# Patient Record
Sex: Female | Born: 1975 | Hispanic: Yes | Marital: Single | Smoking: Never smoker
Health system: Southern US, Community
[De-identification: ages and names within clinical notes are randomized; demographics above are authoritative.]

## PROBLEM LIST (undated history)

## (undated) DIAGNOSIS — F319 Bipolar disorder, unspecified: Secondary | ICD-10-CM

## (undated) DIAGNOSIS — Z5189 Encounter for other specified aftercare: Secondary | ICD-10-CM

## (undated) DIAGNOSIS — D649 Anemia, unspecified: Secondary | ICD-10-CM

## (undated) DIAGNOSIS — K219 Gastro-esophageal reflux disease without esophagitis: Secondary | ICD-10-CM

## (undated) DIAGNOSIS — F32A Depression, unspecified: Secondary | ICD-10-CM

## (undated) DIAGNOSIS — F419 Anxiety disorder, unspecified: Secondary | ICD-10-CM

## (undated) HISTORY — DX: Encounter for other specified aftercare: Z51.89

## (undated) HISTORY — PX: TUBAL LIGATION: SHX77

## (undated) HISTORY — PX: BREAST SURGERY: SHX581

## (undated) HISTORY — DX: Depression, unspecified: F32.A

---

## 2004-10-30 ENCOUNTER — Inpatient Hospital Stay (HOSPITAL_COMMUNITY): Admission: AD | Admit: 2004-10-30 | Discharge: 2004-10-30 | Payer: Self-pay | Admitting: Obstetrics & Gynecology

## 2004-11-08 ENCOUNTER — Inpatient Hospital Stay (HOSPITAL_COMMUNITY): Admission: AD | Admit: 2004-11-08 | Discharge: 2004-11-08 | Payer: Self-pay | Admitting: Obstetrics & Gynecology

## 2007-06-08 ENCOUNTER — Ambulatory Visit: Payer: Self-pay | Admitting: Obstetrics and Gynecology

## 2007-06-08 ENCOUNTER — Encounter: Payer: Self-pay | Admitting: Obstetrics and Gynecology

## 2009-10-24 HISTORY — PX: BREAST BIOPSY: SHX20

## 2020-05-25 ENCOUNTER — Other Ambulatory Visit: Payer: Self-pay | Admitting: Obstetrics and Gynecology

## 2020-05-25 DIAGNOSIS — Z1231 Encounter for screening mammogram for malignant neoplasm of breast: Secondary | ICD-10-CM

## 2020-06-23 ENCOUNTER — Other Ambulatory Visit: Payer: Self-pay

## 2020-06-23 ENCOUNTER — Ambulatory Visit
Admission: RE | Admit: 2020-06-23 | Discharge: 2020-06-23 | Disposition: A | Discharge status: Home / Self Care | Payer: No Typology Code available for payment source | Source: Ambulatory Visit | Attending: Obstetrics and Gynecology | Admitting: Obstetrics and Gynecology

## 2020-06-23 ENCOUNTER — Ambulatory Visit: Payer: Self-pay | Admitting: *Deleted

## 2020-06-23 VITALS — BP 106/62 | Temp 97.7°F | Wt 137.9 lb

## 2020-06-23 DIAGNOSIS — Z01419 Encounter for gynecological examination (general) (routine) without abnormal findings: Secondary | ICD-10-CM

## 2020-06-23 DIAGNOSIS — N9489 Other specified conditions associated with female genital organs and menstrual cycle: Secondary | ICD-10-CM

## 2020-06-23 DIAGNOSIS — Z1231 Encounter for screening mammogram for malignant neoplasm of breast: Secondary | ICD-10-CM

## 2020-06-23 NOTE — Progress Notes (Signed)
[name] is a 44 y.o. No obstetric history on file. female who presents to [facility] clinic today with complaints of right outer breast pain x 2 years that has been occurring since prior to last mammogram 06/28/2018. Patient stated the pain comes and goes. Patient rated the pain at a 2-3 out of 10.    Pap Smear: Pap smear completed today. Last Pap smear was 06/07/2017 and was normal per patient. Per patient has a history of two abnormal Pap smears. The first abnormal was around 2005 or 2006 that a colposcopy was completed for follow-up. 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 Patient stated she has had at least three normal Pap smears since last colposcopy. Last Pap smear result is not available in Epic.   Physical exam: Breasts Breasts symmetrical. No skin abnormalities left breast. Observed a scar on the right outer breast that per patient had a lumpectomy completed in 2011 due to recurrent mastitis. No nipple retraction bilateral breasts. No nipple discharge bilateral breasts. No lymphadenopathy. No lumps palpated bilateral breasts. Complaints of right outer breast tenderness on exam.       Pelvic/Bimanual Ext Genitalia No lesions, no swelling and no discharge observed on external genitalia.        Vagina Vagina pink and normal texture. No lesions or discharge observed in vagina.        Cervix Cervix is present. Cervix pink and of normal texture. No discharge observed.    Uterus Uterus is present and palpable. Uterus in normal position and normal size.        Adnexae Bilateral ovaries present and palpable. Complaints of right ovary tenderness on exam. Sent referral to the [facility] for [place] for follow-up.   Rectovaginal No rectal exam completed today since patient had no rectal complaints. No skin abnormalities observed on exam.     Smoking History: Patient has never smoked.   Patient  Navigation: Patient education provided. Access to services provided for patient through B[facility]CP program.    Breast and Cervical Cancer Risk Assessment: Patient does not have family history of breast cancer, known genetic mutations, or radiation treatment to the chest before age 25. Per patient has history of cervical dysplasia. Patient has no history of being immunocompromised or DES exposure in-utero.  Risk Assessment    Risk Scores      06/23/2020   Last edited by: Demetrius Revel, LPN   5-year risk: 0.6 %   Lifetime risk: 6.6 %          A: B[facility]CP exam with pap smear Complaint of right outer breast pain x 2 years. Previous diagnostic mammogram 06/28/2018 was negative.  P: Referred patient to the [facility] for a screening mammogram on the mobile unit. Appointment scheduled Tuesday, June 23, 2020 at 1500.  Loletta Parish, RN 06/23/2020 2:57 PM

## 2020-06-23 NOTE — Patient Instructions (Signed)
Explained breast self awareness with [name]. Pap smear completed today. Let patient know [facility]CP will cover Pap smears and HPV typing every 5 years unless has a history of abnormal Pap smears. Referred patient to the [facility] for a screening mammogram on the mobile unit. Appointment scheduled Tuesday, June 23, 2020 at 1500. Patient escorted to mobile unit for mammogram following B[facility]CP appointment. Let patient know will follow up with her within the next couple weeks with results of Pap smear by letter or phone. Informed patient that the [facility] will follow-up with her within the next couple of weeks with results of mammogram by letter or phone. Told patient that the [facility] [place] will call you with follow-up appointment for the AUB and right ovary pain. Patient informed that [facility] will not cover follow-up appointment at the [facility] for [place] and right ovary pain. Patient given the [place] application. [name] verbalized understanding.  Wilbon Obenchain, Arvil Chaco, RN 2:58 PM

## 2020-06-26 LAB — CYTOLOGY - PAP
Comment: NEGATIVE
Diagnosis: UNDETERMINED — AB
High risk HPV: NEGATIVE

## 2020-07-01 ENCOUNTER — Telehealth: Payer: Self-pay

## 2020-07-01 NOTE — Telephone Encounter (Signed)
(  06/30/2020) [place], [place], Patient informed pap results: ASC-US, HPV-negative. Discussed ASC-US with patient, states all questions were answered, verbalized understanding.

## 2020-08-18 ENCOUNTER — Encounter: Payer: Self-pay | Admitting: Family Medicine

## 2020-08-18 ENCOUNTER — Other Ambulatory Visit: Payer: Self-pay

## 2020-08-18 ENCOUNTER — Ambulatory Visit (INDEPENDENT_AMBULATORY_CARE_PROVIDER_SITE_OTHER): Payer: No Typology Code available for payment source | Admitting: Family Medicine

## 2020-08-18 VITALS — BP 126/88 | HR 79 | Wt 143.2 lb

## 2020-08-18 DIAGNOSIS — N939 Abnormal uterine and vaginal bleeding, unspecified: Secondary | ICD-10-CM

## 2020-08-18 NOTE — Progress Notes (Signed)
   Subjective:    Patient ID: Samantha Day, female    DOB: [birth year], 44 y.o.   MRN: [id number]  HPI This is a 44 yo female with a history of AUB. She has always had heavy bleeding with regular cycle. 7-8 days of bleeding, changing saturated pads 5-6 times during the day, bleeds through pads at night. Has tried depo, COCs, patch in the past. Had side effects with these - increased anxiety, headaches, weight gain.   She was admitted recently for anemia and needed blood transfusion. Still feel weak and tired during periods.  I have reviewed the patients past medical, family, and social history.  I have reviewed the patient's medication list and allergies.   Review of Systems     Objective:   Physical Exam Vitals reviewed. Exam conducted with a chaperone present.  Constitutional:      Appearance: Normal appearance.  Cardiovascular:     Rate and Rhythm: Normal rate and regular rhythm.  Abdominal:     General: There is no distension.     Palpations: There is no mass.     Tenderness: There is no abdominal tenderness. There is no guarding or rebound.     Hernia: No hernia is present. There is no hernia in the left inguinal area or right inguinal area.  Genitourinary:    General: Normal vulva.     Exam position: Supine.     Labia:        Right: No rash, tenderness or lesion.        Left: No rash, tenderness or lesion.      Vagina: No signs of injury and foreign body. No vaginal discharge, erythema, tenderness or bleeding.     Cervix: No cervical motion tenderness, discharge, friability, lesion, erythema or cervical bleeding.     Uterus: Enlarged (14 week uterus size). Not deviated, not fixed and not tender.      Adnexa:        Right: No mass, tenderness or fullness.         Left: No mass, tenderness or fullness.    Lymphadenopathy:     Lower Body: No right inguinal adenopathy. No left inguinal adenopathy.  Skin:    Capillary Refill: Capillary refill takes less than 2 seconds.    Neurological:     General: No focal deficit present.     Mental Status: She is alert.  Psychiatric:        Mood and Affect: Mood normal.        Behavior: Behavior normal.        Thought Content: Thought content normal.        Judgment: Judgment normal.       Assessment & Plan:  1. Abnormal uterine bleeding (AUB) Check labs and [place]. Discussed IUD vs surgical options. Patient would like to think of options. Applying for financial assistance. - CBC - TSH - Follicle stimulating hormone - LH - US PELVIS TRANSVAGINAL NON-OB (TV ONLY); Future

## 2020-08-18 NOTE — Progress Notes (Signed)
U/S scheduled for 08/31/20 @ 1000.  Pt notified.

## 2020-08-19 LAB — CBC
Hematocrit: 36.5 % (ref 34.0–46.6)
Hemoglobin: 11.4 g/dL (ref 11.1–15.9)
MCH: 26.5 pg — ABNORMAL LOW (ref 26.6–33.0)
MCHC: 31.2 g/dL — ABNORMAL LOW (ref 31.5–35.7)
MCV: 85 fL (ref 79–97)
Platelets: 325 10*3/uL (ref 150–450)
RBC: 4.3 x10E6/uL (ref 3.77–5.28)
RDW: 15.8 % — ABNORMAL HIGH (ref 11.7–15.4)
WBC: 6.7 10*3/uL (ref 3.4–10.8)

## 2020-08-19 LAB — FOLLICLE STIMULATING HORMONE: FSH: 12.4 m[IU]/mL

## 2020-08-19 LAB — LUTEINIZING HORMONE: LH: 11.5 m[IU]/mL

## 2020-08-19 LAB — TSH: TSH: 2.2 u[IU]/mL (ref 0.450–4.500)

## 2020-08-24 ENCOUNTER — Telehealth: Payer: Self-pay | Admitting: Lactation Services

## 2020-08-24 NOTE — Telephone Encounter (Signed)
-----   Message from Truett Mainland, DO sent at 08/21/2020  3:26 PM EDT ----- Please let the patient know that her labs were all normal.

## 2020-08-24 NOTE — Telephone Encounter (Signed)
Called to inform patient that her labs were all normal. Patient without questions or concerns.

## 2020-08-31 ENCOUNTER — Other Ambulatory Visit: Payer: Self-pay

## 2020-08-31 ENCOUNTER — Ambulatory Visit
Admission: RE | Admit: 2020-08-31 | Discharge: 2020-08-31 | Disposition: A | Discharge status: Home / Self Care | Payer: No Typology Code available for payment source | Source: Ambulatory Visit | Attending: Family Medicine | Admitting: Family Medicine

## 2020-08-31 DIAGNOSIS — N939 Abnormal uterine and vaginal bleeding, unspecified: Secondary | ICD-10-CM | POA: Insufficient documentation

## 2020-10-07 ENCOUNTER — Encounter: Payer: Self-pay | Admitting: Obstetrics & Gynecology

## 2020-10-07 ENCOUNTER — Ambulatory Visit: Payer: No Typology Code available for payment source | Admitting: Family Medicine

## 2020-10-07 ENCOUNTER — Other Ambulatory Visit: Payer: Self-pay

## 2020-10-07 ENCOUNTER — Ambulatory Visit: Payer: Self-pay | Admitting: Clinical

## 2020-10-07 ENCOUNTER — Ambulatory Visit (INDEPENDENT_AMBULATORY_CARE_PROVIDER_SITE_OTHER): Payer: Self-pay | Admitting: Obstetrics & Gynecology

## 2020-10-07 VITALS — BP 116/83 | HR 70 | Wt 137.4 lb

## 2020-10-07 DIAGNOSIS — D252 Subserosal leiomyoma of uterus: Secondary | ICD-10-CM

## 2020-10-07 DIAGNOSIS — Z1331 Encounter for screening for depression: Secondary | ICD-10-CM

## 2020-10-07 DIAGNOSIS — D251 Intramural leiomyoma of uterus: Secondary | ICD-10-CM

## 2020-10-07 DIAGNOSIS — D250 Submucous leiomyoma of uterus: Secondary | ICD-10-CM

## 2020-10-07 DIAGNOSIS — N920 Excessive and frequent menstruation with regular cycle: Secondary | ICD-10-CM

## 2020-10-07 LAB — POCT PREGNANCY, URINE: Preg Test, Ur: NEGATIVE

## 2020-10-07 MED ORDER — NAPROXEN 500 MG PO TABS
500.0000 mg | ORAL_TABLET | Freq: Two times a day (BID) | ORAL | 2 refills | Status: DC
Start: 2020-10-07 — End: 2021-01-20

## 2020-10-07 NOTE — Progress Notes (Signed)
GYNECOLOGY OFFICE VISIT NOTE  History:   Samantha Day is a 44 y.o. [phone] here today for discussion about management of menorrhagia and fibroids. Was seen by Dr. Nehemiah Settle on 08/18/2020, reports having no success and concerning side effects after trying hormonal management options (Depo Provera, COCs, patch). She denies any current abnormal vaginal discharge, bleeding, pelvic pain or other concerns.    Past Medical History:  Diagnosis Date  . Blood transfusion without reported diagnosis   . Depression     Past Surgical History:  Procedure Laterality Date  . BREAST SURGERY    . TUBAL LIGATION      The following portions of the patient's history were reviewed and updated as appropriate: allergies, current medications, past family history, past medical history, past social history, past surgical history and problem list.   Health Maintenance:  ASCUS pap and negative HRHPV on 06/23/2020.  Normal mammogram on 06/25/2020.   Review of Systems:  Pertinent items noted in HPI and remainder of comprehensive ROS otherwise negative.  Physical Exam:  BP 116/83   Pulse 70   Wt 137 lb 6.4 oz (62.3 kg)   LMP 09/28/2020 (Exact Date)  CONSTITUTIONAL: Well-developed, well-nourished female in no acute distress.  HEENT:  Normocephalic, atraumatic. External right and left ear normal. No scleral icterus.  NECK: Normal range of motion, supple, no masses noted on observation SKIN: No rash noted. Not diaphoretic. No erythema. No pallor. MUSCULOSKELETAL: Normal range of motion. No edema noted. NEUROLOGIC: Alert and oriented to person, place, and time. Normal muscle tone coordination. No cranial nerve deficit noted. PSYCHIATRIC: Normal mood and affect. Normal behavior. Normal judgment and thought content. CARDIOVASCULAR: Normal heart rate noted RESPIRATORY: Effort and breath sounds normal, no problems with respiration noted ABDOMEN: No masses noted. No other overt distention noted.   PELVIC:  Deferred  Labs and Imaging Results for orders placed or performed in visit on 10/07/20 (from the past 168 hour(s))  Pregnancy, urine POC   Collection Time: 10/07/20 10:28 AM  Result Value Ref Range   Preg Test, Ur NEGATIVE NEGATIVE   CBC Latest Ref Rng & Units 08/18/2020  WBC 3.4 - 10.8 x10E3/uL 6.7  Hemoglobin 11.1 - 15.9 g/dL 11.4  Hematocrit 34.0 - 46.6 % 36.5  Platelets 150 - 450 x10E3/uL 325   US PELVIS TRANSVAGINAL NON-OB (TV ONLY)  Result Date: 09/01/2020 CLINICAL DATA:  Abnormal uterine bleeding. Enlarged uterus on pelvic exam. LMP 08/04/2020. EXAM: ULTRASOUND PELVIS TRANSVAGINAL TECHNIQUE: Transvaginal ultrasound examination of the pelvis was performed including evaluation of the uterus, ovaries, adnexal regions, and pelvic cul-de-sac. COMPARISON:  None. FINDINGS: Uterus Measurements: 10.9 x 6.2 x 9.1 cm = volume: 321 mL. Multiple uterine fibroids are seen, which are submucosal, intramural, and subserosal in location. The 3 largest fibroids measure 4.2 cm, 2.2 cm, and 1.4 cm in maximum diameter. Endometrium Thickness: 19 mm.  No focal abnormality visualized. Right ovary Measurements: 4.0 x 2.3 x 2.7 cm = volume: 12.8 mL. Normal appearance/no adnexal mass. Left ovary Measurements: 3.8 x 1.9 x 1.6 cm = volume: 6.2 mL. Normal appearance/no adnexal mass. Other findings:  No abnormal free fluid IMPRESSION: Multiple uterine fibroids, largest measuring 4.2 cm. Endometrial thickness measures 19 mm. If bleeding remains unresponsive to hormonal or medical therapy, focal lesion work-up with sonohysterogram should be considered. Endometrial biopsy should also be considered in pre-menopausal patients at high risk for endometrial carcinoma. (Ref: Radiological Reasoning: Algorithmic Workup of Abnormal Vaginal Bleeding with Endovaginal Sonography and Sonohysterography. AJR 2008; 979:G92-11) Normal appearance  of both ovaries. Electronically Signed   By: Marlaine Hind M.D.   On: 09/01/2020 08:28        Assessment and Plan:      1. Positive depression screening Positive PHQ9 screen on check in, talked to [facility] clinician today. Denies HI/SI. Also had food insecuroty issues, taken to [place]. - Amb ref to [facility]  2. Menorrhagia with regular cycle 3. Intramural, submucous, and subserous leiomyoma of uterus Discussed management options for abnormal uterine bleeding and fibroids including NSAIDs (Naproxen), tranexamic acid (Lysteda), Oriahnn, oral progesterone, Depo Provera, Levonogestrel IUD, endometrial ablation or hysterectomy (TVH given uterine size and three term SVDs) as definitive surgical management.  Discussed risks and benefits of each method.  She wants to avoid hormones given previous side effects.  Patient desires minimally invasive procedure of hysteroscopic myomectomy and endometrial ablation.  Printed patient education handouts were given to the patient to review at home. Advised to research Lysteda for now, but Naproxen prescribed.   Bleeding precautions reviewed.  - naproxen (NAPROSYN) 500 MG tablet; Take 1 tablet (500 mg total) by mouth 2 (two) times daily with a meal. As needed for pain and during periods.   Dispense: 30 tablet; Refill: 2  Procedure: Hysteroscopy, Dilation and Curettage, Myomectomy with Myosure, Hydrothermal Endometrial Ablation  Indications: Menorrhagia, uterine fibroids with possible submucosal component.  The risks of surgery were discussed in detail with the patient including but not limited to: bleeding; infection; injury to uterus or other surrounding organs; formation of intrauterine adhesions; need for additional procedures including laparoscopy or subsequent procedures secondary to abnormal pathology; thromboembolic phenomenon; and other postoperative or anesthesia complications. Postoperative expectations were also discussed in detail. The patient also understands the alternative treatment options which were discussed in full. All  questions were answered.  She was told that she will be contacted by our surgical scheduler regarding the time and date of her surgery; routine preoperative instructions will be given to her by the preoperative nursing team.   She is aware of need for preoperative COVID testing and subsequent quarantine from time of test to time of surgery; she will be given further preoperative instructions at that [place] screening visit.  Printed patient education handouts about the procedure were given to the patient to review at home.   Please refer to After Visit Summary for other counseling recommendations.   Routine preventative health maintenance measures emphasized. Please refer to After Visit Summary for other counseling recommendations.   Return for any gynecologic concerns.    I spent 30 minutes dedicated to the care of this patient including pre-visit review of records, face to face time with the patient discussing her conditions and treatments and post visit ordering of testing.    Verita Schneiders, MD, [place] for [employer], [place]

## 2020-10-07 NOTE — BH Specialist Note (Signed)
Integrated [facility]al [facility] Initial In-Person Visit  MRN: [id number] Name: Samantha Day  Number of [place] Clinician visits:: 1/6 Session Start time: 11:05  Session End time: 11:15 Total time: 10 minutes  Types of Service: Introduction only  Interpretor:No. Interpretor Name and Language: n/a   Warm Hand Off Completed.       Subjective: Samantha Day is a 44 y.o. female accompanied by n/a Patient was referred by Verita Schneiders, MD for positive depression screen. Patient reports the following symptoms/concerns: Pt denies current SI, no plan, no intent; has passive SI at times; is currently seeing therapist every two weeks and her PCP is managing her [place] medication. Pt is taking Zoloft 50mg  (increased to 75mg  makes her "jittery", "can't sleep", so went back down to 50mg , which helps improve her sleep prior to no meds). Pt has had some difficulty with sleep the past few weeks, and increased anxiety.  Duration of problem: Ongoing; Severity of problem: moderately severe  Objective: Mood: Anxious and Affect: Appropriate Risk of harm to self or others: Suicidal ideation No plan to harm self or others Passive SI with none current  Life Context: Family and Social: - School/Work: - Self-Care: - Life Changes: -  Patient and/or Family's Strengths/Protective Factors: Adhering to current treatment   Goals Addressed: Patient will: 1. Reduce symptoms of: anxiety and depression 2. Increase knowledge and/or ability of: stress reduction  3. Demonstrate ability to: Safety  Progress towards Goals: Ongoing   Interventions: Interventions utilized: Supportive Counseling and Link to [employer]  Standardized Assessments completed: GAD-7 and PHQ 9  Patient and/or Family Response: Pt agrees with treatment plan  Patient Centered Plan: Patient is on the following Treatment Plan(s):  N/A  Assessment: Patient currently experiencing Positive depression  screen.   Patient may benefit from supportive counseling today and link to community resource.  Plan: 1. Follow up with behavioral health clinician on : Call Farah Benish at [phone] as needed 2. Behavioral recommendations:  -Continue attending therapy sessions and following therapist treatment plan -Continue seeing PCP for [facility] medication management -Follow Safety Plan with therapist (and may use [facility] walk-in 24/7 for [facility], if SI returns) -Consider using sleep sound app, as discussed (may help with falling asleep) -Take home food from [employer] today 3. Referral(s): Integrated [employer] (In Clinic) and Community Resources:  Food  Caroleen Hamman [place], [place]  Depression screen [facility] 2/9 10/07/2020 08/18/2020  Decreased Interest 1 1  Down, Depressed, Hopeless 1 1  PHQ - 2 Score 2 2  Altered sleeping 1 1  Tired, decreased energy 1 1  Change in appetite 1 1  Feeling bad or failure about yourself  1 2  Trouble concentrating 1 1  Moving slowly or fidgety/restless 1 0  Suicidal thoughts 1 0  PHQ-9 Score 9 8   GAD 7 : Generalized Anxiety Score 10/07/2020 08/18/2020  Nervous, Anxious, on Edge 2 2  Control/stop worrying 2 2  Worry too much - different things 2 2  Trouble relaxing 2 2  Restless 2 1  Easily annoyed or irritable 2 1  Afraid - awful might happen 2 2  Total GAD 7 Score 14 12

## 2020-10-07 NOTE — Patient Instructions (Addendum)
Research [place] for reducing bleeding during menstrual periods  Take Naproxen twice a day during periods, with food  You will be contacted with details about upcoming surgery  Hysteroscopy Hysteroscopy is a procedure that is used to examine the inside of a woman's womb (uterus). This may be done for various reasons, including:  To look for lumps (tumors) and other growths in the uterus.  To evaluate abnormal bleeding, fibroid tumors, polyps, scar tissue (adhesions), or cancer of the uterus.  To determine the cause of an inability to get pregnant (infertility) or repeated losses of pregnancies (miscarriages).  To find a lost IUD (intrauterine device).  To perform a procedure that permanently prevents pregnancy (sterilization). During this procedure, a thin, flexible tube with a small light and camera (hysteroscope) is used to examine the uterus. The camera sends images to a monitor in the room so that your health care provider can view the inside of your uterus. A hysteroscopy should be done right after a menstrual period to make sure that you are not pregnant. Tell a health care provider about:  Any allergies you have.  All medicines you are taking, including vitamins, herbs, eye drops, creams, and over-the-counter medicines.  Any problems you or family members have had with the use of anesthetic medicines.  Any blood disorders you have.  Any surgeries you have had.  Any medical conditions you have.  Whether you are pregnant or may be pregnant. What are the risks? Generally, this is a safe procedure. However, problems may occur, including:  Excessive bleeding.  Infection.  Damage to the uterus or other structures or organs.  Allergic reaction to medicines or fluids that are used in the procedure. What happens before the procedure? Staying hydrated Follow instructions from your health care provider about hydration, which may include:  Up to 2 hours before the procedure  - you may continue to drink clear liquids, such as water, clear fruit juice, black coffee, and plain tea. Eating and drinking restrictions Follow instructions from your health care provider about eating and drinking, which may include:  8 hours before the procedure - stop eating solid foods and drink clear liquids only  2 hours before the procedure - stop drinking clear liquids. General instructions  Ask your health care provider about: ? Changing or stopping your normal medicines. This is important if you take diabetes medicines or blood thinners. ? Taking medicines such as aspirin and ibuprofen. These medicines can thin your blood and cause bleeding. Do not take these medicines for 1 week before your procedure, or as told by your health care provider.  Do not use any products that contain nicotine or tobacco for 2 weeks before the procedure. This includes cigarettes and e-cigarettes. If you need help quitting, ask your health care provider.  Medicine may be placed in your cervix the day before the procedure. This medicine causes the cervix to have a larger opening (dilate). The larger opening makes it easier for the hysteroscope to be inserted into the uterus during the procedure.  Plan to have someone with you for the first 24-48 hours after the procedure, especially if you are given a medicine to make you fall asleep (general anesthetic).  Plan to have someone take you home from the hospital or clinic. What happens during the procedure?  To lower your risk of infection: ? Your health care team will wash or sanitize their hands. ? Your skin will be washed with soap. ? Hair may be removed from the surgical  area.  An IV tube will be inserted into one of your veins.  You may be given one or more of the following: ? A medicine to help you relax (sedative). ? A medicine that numbs the area around the cervix (local anesthetic). ? A medicine to make you fall asleep (general  anesthetic).  A hysteroscope will be inserted through your vagina and into your uterus.  Air or fluid will be used to enlarge your uterus, enabling your health care provider to see your uterus better. The amount of fluid used will be carefully checked throughout the procedure.  In some cases, tissue may be gently scraped from inside the uterus and sent to a lab for testing (biopsy). The procedure may vary among health care providers and hospitals. What happens after the procedure?  Your blood pressure, heart rate, breathing rate, and blood oxygen level will be monitored until the medicines you were given have worn off.  You may have some cramping. You may be given medicines for this.  You may have bleeding, which varies from light spotting to menstrual-like bleeding. This is normal.  If you had a biopsy done, it is your responsibility to get the results of your procedure. Ask your health care provider, or the department performing the procedure, when your results will be ready. Summary  Hysteroscopy is a procedure that is used to examine the inside of a woman's womb (uterus).  After the procedure, you may have bleeding, which varies from light spotting to menstrual-like bleeding. This is normal. You may also have cramping.  Plan to have someone take you home from the hospital or clinic. This information is not intended to replace advice given to you by your health care provider. Make sure you discuss any questions you have with your health care provider. Document Revised: 09/22/2017 Document Reviewed: 11/08/2016 Elsevier Patient Education  2020 [place].    Endometrial Ablation Endometrial ablation is a procedure that destroys the thin inner layer of the lining of the uterus (endometrium). This procedure may be done:  To stop heavy periods.  To stop bleeding that is causing anemia.  To control irregular bleeding.  To treat bleeding caused by small tumors (fibroids) in the  endometrium. This procedure is often an alternative to major surgery, such as removal of the uterus and cervix (hysterectomy). As a result of this procedure:  You may not be able to have children. However, if you are premenopausal (you have not gone through menopause): ? You may still have a small chance of getting pregnant. ? You will need to use a reliable method of birth control after the procedure to prevent pregnancy.  You may stop having a menstrual period, or you may have only a small amount of bleeding during your period. Menstruation may return several years after the procedure. Tell a health care provider about:  Any allergies you have.  All medicines you are taking, including vitamins, herbs, eye drops, creams, and over-the-counter medicines.  Any problems you or family members have had with the use of anesthetic medicines.  Any blood disorders you have.  Any surgeries you have had.  Any medical conditions you have. What are the risks? Generally, this is a safe procedure. However, problems may occur, including:  A hole (perforation) in the uterus or bowel.  Infection of the uterus, bladder, or vagina.  Bleeding.  Damage to other structures or organs.  An air bubble in the lung (air embolus).  Problems with pregnancy after the procedure.  Failure of the procedure.  Decreased ability to diagnose cancer in the endometrium. What happens before the procedure?  You will have tests of your endometrium to make sure there are no pre-cancerous cells or cancer cells present.  You may have an ultrasound of the uterus.  You may be given medicines to thin the endometrium.  Ask your health care provider about: ? Changing or stopping your regular medicines. This is especially important if you take diabetes medicines or blood thinners. ? Taking medicines such as aspirin and ibuprofen. These medicines can thin your blood. Do not take these medicines before your procedure if  your doctor tells you not to.  Plan to have someone take you home from the hospital or clinic. What happens during the procedure?   You will lie on an exam table with your feet and legs supported as in a pelvic exam.  To lower your risk of infection: ? Your health care team will wash or sanitize their hands and put on germ-free (sterile) gloves. ? Your genital area will be washed with soap.  An IV tube will be inserted into one of your veins.  You will be given a medicine to help you relax (sedative).  A surgical instrument with a light and camera (resectoscope) will be inserted into your vagina and moved into your uterus. This allows your surgeon to see inside your uterus.  Endometrial tissue will be removed using one of the following methods: ? Radiofrequency. This method uses a radiofrequency-alternating electric current to remove the endometrium. ? Cryotherapy. This method uses extreme cold to freeze the endometrium. ? Heated-free liquid. This method uses a heated saltwater (saline) solution to remove the endometrium. ? Microwave. This method uses high-energy microwaves to heat up the endometrium and remove it. ? Thermal balloon. This method involves inserting a catheter with a balloon tip into the uterus. The balloon tip is filled with heated fluid to remove the endometrium. The procedure may vary among health care providers and hospitals. What happens after the procedure?  Your blood pressure, heart rate, breathing rate, and blood oxygen level will be monitored until the medicines you were given have worn off.  As tissue healing occurs, you may notice vaginal bleeding for 4-6 weeks after the procedure. You may also experience: ? Cramps. ? Thin, watery vaginal discharge that is light pink or brown in color. ? A need to urinate more frequently than usual. ? Nausea.  Do not drive for 24 hours if you were given a sedative.  Do not have sex or insert anything into your vagina  until your health care provider approves. Summary  Endometrial ablation is done to treat the many causes of heavy menstrual bleeding.  The procedure may be done only after medications have been tried to control the bleeding.  Plan to have someone take you home from the hospital or clinic. This information is not intended to replace advice given to you by your health care provider. Make sure you discuss any questions you have with your health care provider. Document Revised: 03/27/2018 Document Reviewed: 10/27/2016 Elsevier Patient Education  [place].      Vaginal Hysterectomy  A vaginal hysterectomy is a procedure to remove all or part of the uterus through a small incision in the vagina. In this procedure, your health care provider may remove your entire uterus, including the lower end (cervix). You may need a vaginal hysterectomy to treat:  Uterine fibroids.  A condition that causes the lining of the  uterus to grow in other areas (endometriosis).  Problems with pelvic support.  Cancer of the cervix, ovaries, uterus, or tissue that lines the uterus (endometrium).  Excessive (dysfunctional) uterine bleeding. When removing your uterus, your health care provider may also remove the organs that produce eggs (ovaries) and the tubes that carry eggs to your uterus (fallopian tubes). After a vaginal hysterectomy, you will no longer be able to have a baby. You will also no longer get your menstrual period. Tell a health care provider about:  Any allergies you have.  All medicines you are taking, including vitamins, herbs, eye drops, creams, and over-the-counter medicines.  Any problems you or family members have had with anesthetic medicines.  Any blood disorders you have.  Any surgeries you have had.  Any medical conditions you have.  Whether you are pregnant or may be pregnant. What are the risks? Generally, this is a safe procedure. However, problems may occur,  including:  Bleeding.  Infection.  A blood clot that forms in your leg and travels to your lungs (pulmonary embolism).  Damage to surrounding organs.  Pain during sex. What happens before the procedure?  Ask your health care provider what organs will be removed during surgery.  Ask your health care provider about: ? Changing or stopping your regular medicines. This is especially important if you are taking diabetes medicines or blood thinners. ? Taking medicines such as aspirin and ibuprofen. These medicines can thin your blood. Do not take these medicines before your procedure if your health care provider instructs you not to.  Follow instructions from your health care provider about eating or drinking restrictions.  Do not use any tobacco products, such as cigarettes, chewing tobacco, and e-cigarettes. If you need help quitting, ask your health care provider.  Plan to have someone take you home after discharge from the hospital. What happens during the procedure?  To reduce your risk of infection: ? Your health care team will wash or sanitize their hands. ? Your skin will be washed with soap.  An IV tube will be inserted into one of your veins.  You may be given antibiotic medicine to help prevent infection.  You will be given one or more of the following: ? A medicine to help you relax (sedative). ? A medicine to numb the area (local anesthetic). ? A medicine to make you fall asleep (general anesthetic). ? A medicine that is injected into an area of your body to numb everything beyond the injection site (regional anesthetic).  Your surgeon will make an incision in your vagina.  Your surgeon will locate and remove all or part of your uterus.  Your ovaries and fallopian tubes may be removed at the same time.  The incision will be closed with stitches (sutures) that dissolve over time. The procedure may vary among health care providers and hospitals. What happens after  the procedure?  Your blood pressure, heart rate, breathing rate, and blood oxygen level will be monitored often until the medicines you were given have worn off.  You will be encouraged to get up and walk around after a few hours to help prevent complications.  You may have IV tubes in place for a few days.  You will be given pain medicine as needed.  Do not drive for 24 hours if you were given a sedative. This information is not intended to replace advice given to you by your health care provider. Make sure you discuss any questions you have with your  health care provider. Document Revised: 06/02/2016 Document Reviewed: 10/25/2015 Elsevier Patient Education  2020 [place] you for enrolling in [place]. Please follow the instructions below to securely access your online medical record. MyChart allows you to send messages to your doctor, view your test results, manage appointments, and more.   How Do I Sign Up? 1. In your Internet browser, go to [employer] and enter [email or web address]. 2. Click on the Sign Up Now link in the Sign In box. You will see the New Member Sign Up page. 3. Enter your MyChart Access Code exactly as it appears below. You will not need to use this code after you've completed the sign-up process. If you do not sign up before the expiration date, you must request a new code.  MyChart Access Code: [email or web address] Expires: 11/21/2020 10:14 AM  4. Enter your Social Security Number ([id number]) and Date of Birth (mm/dd/yyyy) as indicated and click Submit. You will be taken to the next sign-up page. 5. Create a MyChart ID. This will be your MyChart login ID and cannot be changed, so think of one that is secure and easy to remember. 6. Create a MyChart password. You can change your password at any time. 7. Enter your Password Reset Question and Answer. This can be used at a later time if you forget your password.  8. Enter your e-mail address.  You will receive e-mail notification when new information is available in [place]. 9. Click Sign Up. You can now view your medical record.   Additional Information Remember, MyChart is NOT to be used for urgent needs. For medical emergencies, dial 911.  Behavioral Health Resources:   What if I or someone I know is in crisis?  . If you are thinking about harming yourself or having thoughts of suicide, or if you know someone who is, seek help right away.  . Call your doctor or mental health care provider.  . Call 911 or go to a hospital emergency room to get immediate help, or ask a friend or family member to help you do these things; IF YOU ARE IN [place], YOU MAY GO TO WALK-IN URGENT CARE 24/7 at [facility] (see below)  . Call the [place] National Suicide Prevention Lifeline's toll-free, 24-hour hotline at 1-800-273-TALK 331 388 8703) or TTY: 1-800-799-4 TTY 706 432 0051) to talk to a trained counselor.  . If you are in crisis, make sure you are not left alone.   . If someone else is in crisis, make sure he or she is not left alone   24 Hour :   [facility]  [address], [place], [place] [address] [phone] or [place] 24/7  [facility] Mobile Crisis: [phone]  [place] National Suicide Hotline: 9704180330  [facility] [employer] (Domestic Violence, Rape & Victim Assistance)  3190367649  [place]  [address], [place]  [address]   [phone] or [phone]   [place]: [phone] (8am-4pm) or [phone] (after hours)        [facility], [address], [place], [place] Fax: [phone] [email or web address] *Interpreters available *Accepts all insurance and uninsured for Urgent Care needs *Accepts Medicaid and  uninsured for outpatient treatment   [facility]   Mon-Fri: 8am-5pm [address], [place], [place] [phone](phone); [phone]) [email or web address]  *Accepts Medicare  [facility] Mon,  Tues, Thurs, Fri: 8am-4pm [place], [place], [place] [address] [phone] (phone); [phone] (fax) [email or web address]  *Accepts Medicare  [facility] Mon-Fri: 9am-5pm  [address] [place], [place] (phone); [phone]  [email or web address]  *Accepts Medicaid  Jinny Blossom [facility] [address] Johnette Abraham [place], [place] SalonLookup.es   [facility] [place], 8:30am-12pm/1pm-2:30pm [address] [place], [place] (phone); [phone] (fax) [email or web address]  *Accepts Medicaid, sliding-scale*Bilingual services available  [facility] Mon-Fri, 8am-7pm [place], [place]  [phone](phone); [phone]) www.[email or web address]  *Accepts Medicaid *Bilingual services available  [facility] Mon-Fri: 8am-5pm, Saturday by appointment only [place], [place], [place] (phone); [phone] (fax) [email or web address]   [facility] [address], [place], [place], [place] [email or web address]  *Free & reduced services for uninsured and underinsured individuals *Bilingual services for Spanish-speaking clients 21 and under  [facility], [address], [place], [place]); [phone]) [email or web address].[email or web address]  *Bring your own interpreter at first visit *Accepts Medicare and [facility]  [place] Mon-Fri: 9am-5:30pm [address], [place], [place],  [place] (phone), [phone] (fax) After hours crisis line: [phone] www.[email or web address]  *Accepts Medicare and Medicaid  [facility], 8am-6pm [address] [place], [place] (phone); [phone] (fax) [email or web address]  *Subsidized costs available  [facility] Mon-Fri: 8am-4pm [address] [place], [place] [phone](phone); [phone]) [email or web address]  *Accepts Medicaid  [facility] Same day access hours: Mon-Fri, 8:30-3pm Crisis hours: Mon-Fri, 8am-5pm [address] [place], [place] (336) [place] Same day access hours: Mon-Fri, 8:30-3pm Crisis hours: Mon-Fri, 8am-8pm [address], [place], [place] (phone); [phone] (fax) www.[email or web address]  *Accepts Medicaid and Medicare  [facility] [place] Mon, [place], Fri: 9am-9pm Tues, Thurs: 9am-6pm [place], [place], [place] (phone); [phone] (fax) [email or web address]  *(Accepts Medicare and Medicaid; payment plans available)*Bilingual services available  [facility] [address], [place], [place] (phone); [phone] (fax) [email or web address]   [facility] [address] [place], [place], [place]  [email or web address]  *Bilingual services available  [facility]) Mon-Thurs: 8am-8pm [address], [place], [place], [place] (phone); [phone] (fax) [email or web address]  *Accepts Medicaid*Bilingual services available  [facility] [place]. [place], [place] (phone) [email or web address]  *Accepts Medicaid *Bilingual services available  [facility] Mon-Fri, 9am-4:45pm [address],  [place], [place] (phone); [phone] (fax) [email or web address]  *[facility] Mon-Thurs: 8:30-8pm, Fri: 8:30am-7pm [address] [place], [place] (3rd floor) [phone] (phone); [phone] (fax) [email or web address]  *Accepts Medicaid; income-based reduced rates available  [facility] Mon-Fri: 8am-5pm [address], [place], [place] [address] [phone] (phone); [phone] (fax) [email or web address]  *Accepts Medicaid*Bilingual services available   [facility] ([place])  [address] [place] [phone] [email or web address]  *Provides direct services to individuals in recovery from mental illness, including support groups, recovery skills classes, and one on one peer support  NAMI [employer] on [place]) [place] helpline: [phone]  NAMI [place] helpline: 337-322-9558 https://[email or web address]  *A community hub for information relating to local resources and services for the friends and families of individuals living alongside a mental health condition, as well as the individuals themselves. Classes and support groups also provided    /Emotional [employer] and Websites Here are a few free apps meant to help you to help yourself.  To find, try searching on the internet to see if the app is offered on Apple/Android devices. If your first choice doesn't come up on your device, the good news is that there are many choices! Play around with different apps to see which ones are helpful to you.    Calm This is an app meant to help increase calm feelings. Includes info, strategies, and tools for tracking your feelings.      Calm Harm  This app is meant to help with self-harm. Provides many 5-minute or 15-min coping strategies for doing instead of hurting yourself.       [place] is a problem-solving tool to  help deal with emotions and cope with stress you encounter wherever you are.      MindShift This app can help people cope with anxiety. Rather than trying to avoid anxiety, you can make an important shift and face it.      MY3  MY3 features a support system, safety plan and resources with the goal of offering a tool to use in a time of need.       My Life My Voice  This mood journal offers a simple solution for tracking your thoughts, feelings and moods. Animated emoticons can help identify your mood.       Relax Melodies Designed to help with sleep, on this app you can mix sounds and meditations for relaxation.      Smiling Mind Smiling Mind is meditation made easy: it's a simple tool that helps put a smile on your mind.        Stop, Breathe & Think  A friendly, simple guide for people through meditations for mindfulness and compassion.  Stop, Breathe and Think Kids Enter your current feelings and choose a "mission" to help you cope. Offers videos for certain moods instead of just sound recordings.       Team Orange The goal of this tool is to help teens change how they think, act, and react. This app helps you focus on your own good feelings and experiences.      The [place] Box The [place] Box (VHB) contains simple tools to help patients with coping, relaxation, distraction, and positive thinking.     Behavioral Health Resources:   What if I or someone I know is in crisis?  . If you are thinking about harming yourself or having thoughts of suicide, or if you know someone who is, seek help right away.  . Call your doctor or mental health care provider.  . Call 911 or go to a hospital emergency room to get immediate help, or ask a friend or family member to help you do these things; IF YOU ARE IN [place], YOU MAY GO TO WALK-IN URGENT CARE 24/7 at [facility] (see below)  . Call the [place] National Suicide  Prevention Lifeline's toll-free, 24-hour hotline at 1-800-273-TALK 7266842330) or TTY: 1-800-799-4 TTY 534-178-8591) to talk to a trained counselor.  . If you are in crisis, make sure you are not left alone.   . If someone else is in crisis, make sure he or she is not left alone   24 Hour :   [facility]  [address], [place], [place] [address] [phone] or [place]user 24/7  [facility] Mobile Crisis: ([phone]  [place] National Suicide Hotline: (781)341-4273  [facility] [employer] (Domestic Violence, [place])  [place]  [place], [place]  [address]   [phone] or [phone]   [place]: [phone] (8am-4pm) or [phone] (after hours)        [facility], [address], [place], [place] Fax: [phone] [email or web address] *Interpreters available *Accepts all insurance and uninsured for Urgent Care needs *Accepts Medicaid and uninsured for outpatient treatment   [facility]   Mon-Fri: 8am-5pm [place], [place], [place]); ([phone]) BloggerCourse.com  *Accepts Medicare  [facility] [place], Fri: 8am-4pm [place], [place], [place] [address] [phone] (phone); [phone] (fax) TaskTown.es  *[facility] Mon-Fri: 9am-5pm  [address] [place], [place] (phone); [phone]  [email or web address].[email or web address]  *Accepts Medicaid  [facility] [address] Johnette Abraham [place], [place] SalonLookup.es   [facility] of the [place], 8:30am-12pm/1pm-2:30pm [address] [place], [place] (phone); [phone] (fax) www.[email or web address]  *Accepts Medicaid, sliding-scale*Bilingual services available  [facility] Mon-Fri, 8am-7pm [place], [place]  [phone](phone); [phone]) www.[email or web address]  *Accepts Medicaid *Bilingual services available  [facility] Mon-Fri: 8am-5pm, Saturday by appointment only [place], [place], [place] (phone); [phone] (fax) [email or web address]   [facility] [address], [place], [place], [place] [email or web address]  *Free & reduced services for uninsured and underinsured individuals *Bilingual services for Spanish-speaking clients 21 and under  [facility], [address] [place], [place]); [phone]) RunningConvention.de  *Bring your own interpreter at first visit *Accepts Medicare and [facility]  [place] Mon-Fri: 9am-5:30pm [address], [place], [place], [place] (phone), [phone] (fax) After hours crisis line: ([phone] www.[email or web address]  *Accepts Medicare and Medicaid  [employer], 8am-6pm [address], [place], [place] (phone); [phone] (fax) http://[email or web address]  *Subsidized costs available  [facility] Mon-Fri: 8am-4pm [address], [place], [place] [phone](phone); ([phone]) www.p[email or web address]  *Accepts Medicaid  [facility] [place] Same day access hours: Mon-Fri, 8:30-3pm Crisis hours: Mon-Fri, 8am-5pm [address], [place], [place] (336) [place] Same day access hours: Mon-Fri, 8:30-3pm Crisis hours: Mon-Fri, 8am-8pm [address] [place],  [place] (phone); [phone] (fax) www.[email or web address]  *Accepts Medicaid and Medicare  [facility] [place] [facility], [place], Fri: 9am-9pm Tues, Thurs: 9am-6pm [place], [place], [place] (phone); [phone] (fax) https://[email or web address]  *(Accepts Medicare and Medicaid; payment plans available)*Bilingual services available  [facility] [address], [place], [place] (phone); [phone] (fax) www.[email or web address]   [facility] [address] [place], [place], [place]  [email or web address]  *Bilingual services available  [facility]) Mon-Thurs: 8am-8pm [address], [place], [place], [place] (phone); [phone] (fax) [email or web address].[email or web address]  *Accepts Medicaid*Bilingual services available  [facility] [place]. [place], [place] (phone) DeadConnect.[email or web address]  *Accepts Medicaid *Bilingual services available  [facility] Mon-Fri, 9am-4:45pm [address] [place], [place] (phone); [phone] (fax) http://[email or web address]-[email or web address]  *[facility] Mon-Thurs: 8:30-8pm, Fri: 8:30am-7pm [address] [place], [place] (3rd floor) [phone] (phone); [phone] (fax) VIPinterview.si  *Accepts Medicaid; income-based reduced rates available  [facility] Mon-Fri: 8am-5pm [address], [place], [place] [address] [phone] (phone); [phone] (fax) [email or web address]://[email or web address]  *Accepts Medicaid*Bilingual services available   [facility] ([place])  [address] [place] [phone] www.[email or web address]  *Provides direct services to individuals in recovery from mental illness, including support groups, recovery skills classes, and one  on one peer support  NAMI [employer] on [place]) [place] helpline: 928-580-6466  NAMI [place] helpline: 815-815-1432 https://namiguilford.org  *A community hub for information relating to local resources and services for the friends and families of individuals living alongside a mental health condition, as well as the individuals themselves. Classes and support groups also provided   Center for [facility] at [facility] [address] [place], [place] [address] [phone] (main office) [phone] [facility] office)

## 2020-10-28 ENCOUNTER — Encounter (HOSPITAL_BASED_OUTPATIENT_CLINIC_OR_DEPARTMENT_OTHER): Payer: Self-pay | Admitting: Obstetrics & Gynecology

## 2020-10-30 ENCOUNTER — Encounter (HOSPITAL_BASED_OUTPATIENT_CLINIC_OR_DEPARTMENT_OTHER)
Admission: RE | Admit: 2020-10-30 | Discharge: 2020-10-30 | Disposition: A | Discharge status: Home / Self Care | Payer: No Typology Code available for payment source | Source: Ambulatory Visit | Attending: Obstetrics & Gynecology | Admitting: Obstetrics & Gynecology

## 2020-10-30 DIAGNOSIS — Z01818 Encounter for other preprocedural examination: Secondary | ICD-10-CM | POA: Insufficient documentation

## 2020-10-30 LAB — CBC
HCT: 28.7 % — ABNORMAL LOW (ref 36.0–46.0)
Hemoglobin: 9 g/dL — ABNORMAL LOW (ref 12.0–15.0)
MCH: 23.6 pg — ABNORMAL LOW (ref 26.0–34.0)
MCHC: 31.4 g/dL (ref 30.0–36.0)
MCV: 75.3 fL — ABNORMAL LOW (ref 80.0–100.0)
Platelets: 347 10*3/uL (ref 150–400)
RBC: 3.81 MIL/uL — ABNORMAL LOW (ref 3.87–5.11)
RDW: 16 % — ABNORMAL HIGH (ref 11.5–15.5)
WBC: 8.4 10*3/uL (ref 4.0–10.5)
nRBC: 0 % (ref 0.0–0.2)

## 2020-11-02 ENCOUNTER — Other Ambulatory Visit (HOSPITAL_COMMUNITY)
Admission: RE | Admit: 2020-11-02 | Discharge: 2020-11-02 | Disposition: A | Discharge status: Home / Self Care | Payer: No Typology Code available for payment source | Source: Ambulatory Visit | Attending: Obstetrics & Gynecology | Admitting: Obstetrics & Gynecology

## 2020-11-02 DIAGNOSIS — Z01812 Encounter for preprocedural laboratory examination: Secondary | ICD-10-CM | POA: Insufficient documentation

## 2020-11-02 DIAGNOSIS — U071 COVID-19: Secondary | ICD-10-CM

## 2020-11-02 HISTORY — DX: COVID-19: U07.1

## 2020-11-02 NOTE — Progress Notes (Signed)
Labs reviewed by Dr. Miller, will proceed with surgery as scheduled.  

## 2020-11-03 ENCOUNTER — Encounter: Payer: Self-pay | Admitting: Obstetrics & Gynecology

## 2020-11-03 ENCOUNTER — Telehealth: Payer: Self-pay | Admitting: Obstetrics & Gynecology

## 2020-11-03 DIAGNOSIS — U071 COVID-19: Secondary | ICD-10-CM

## 2020-11-03 LAB — SARS CORONAVIRUS 2 (TAT 6-24 HRS): SARS Coronavirus 2: POSITIVE — AB

## 2020-11-03 NOTE — Telephone Encounter (Signed)
     Faculty Practice OB/GYN Physician Phone Call Documentation  I had a phone conversation with Samantha Day about her positive COVID results from her preoperative screening.  Results for orders placed or performed during the hospital encounter of 11/02/20 (from the past 24 hour(s))  SARS CORONAVIRUS 2 (TAT 6-24 HRS) Nasopharyngeal Nasopharyngeal Swab     Status: Abnormal   Collection Time: 11/02/20  2:47 PM   Specimen: Nasopharyngeal Swab  Result Value Ref Range   SARS Coronavirus 2 POSITIVE (A) NEGATIVE   Patient reports having symptoms, as well as her whole family.  Was scheduled for Hysteroscopy, Dilation and Curettage, Myomectomy with Myosure, Hydrothermal Endometrial Ablation on 11/04/2020, this will be postponed until a later date. She was told she will be contacted by surgical scheduler with further information.  [place] Surgical Scheduler, [place] Preop team and [facility] RN team also notified.    Samantha Schneiders, MD, [place] for [employer], [place]

## 2020-11-03 NOTE — Progress Notes (Signed)
Message sent to Dr. Harolyn Rutherford about covid test results.

## 2021-01-13 ENCOUNTER — Other Ambulatory Visit: Payer: Self-pay

## 2021-01-13 ENCOUNTER — Encounter (HOSPITAL_BASED_OUTPATIENT_CLINIC_OR_DEPARTMENT_OTHER): Payer: Self-pay | Admitting: Obstetrics & Gynecology

## 2021-01-18 ENCOUNTER — Encounter (HOSPITAL_BASED_OUTPATIENT_CLINIC_OR_DEPARTMENT_OTHER)
Admission: RE | Admit: 2021-01-18 | Discharge: 2021-01-18 | Disposition: A | Discharge status: Home / Self Care | Payer: No Typology Code available for payment source | Source: Ambulatory Visit | Attending: Obstetrics & Gynecology | Admitting: Obstetrics & Gynecology

## 2021-01-18 DIAGNOSIS — Z01812 Encounter for preprocedural laboratory examination: Secondary | ICD-10-CM | POA: Insufficient documentation

## 2021-01-18 LAB — CBC
HCT: 37.1 % (ref 36.0–46.0)
Hemoglobin: 11.6 g/dL — ABNORMAL LOW (ref 12.0–15.0)
MCH: 24.7 pg — ABNORMAL LOW (ref 26.0–34.0)
MCHC: 31.3 g/dL (ref 30.0–36.0)
MCV: 79.1 fL — ABNORMAL LOW (ref 80.0–100.0)
Platelets: 278 10*3/uL (ref 150–400)
RBC: 4.69 MIL/uL (ref 3.87–5.11)
RDW: 18.9 % — ABNORMAL HIGH (ref 11.5–15.5)
WBC: 6.7 10*3/uL (ref 4.0–10.5)
nRBC: 0 % (ref 0.0–0.2)

## 2021-01-20 ENCOUNTER — Other Ambulatory Visit: Payer: Self-pay

## 2021-01-20 ENCOUNTER — Ambulatory Visit (HOSPITAL_BASED_OUTPATIENT_CLINIC_OR_DEPARTMENT_OTHER): Payer: No Typology Code available for payment source | Admitting: Anesthesiology

## 2021-01-20 ENCOUNTER — Encounter (HOSPITAL_BASED_OUTPATIENT_CLINIC_OR_DEPARTMENT_OTHER): Payer: Self-pay | Admitting: Obstetrics & Gynecology

## 2021-01-20 ENCOUNTER — Ambulatory Visit (HOSPITAL_BASED_OUTPATIENT_CLINIC_OR_DEPARTMENT_OTHER)
Admission: RE | Admit: 2021-01-20 | Discharge: 2021-01-20 | Disposition: A | Discharge status: Home / Self Care | Payer: No Typology Code available for payment source | Attending: Obstetrics & Gynecology | Admitting: Obstetrics & Gynecology

## 2021-01-20 ENCOUNTER — Encounter (HOSPITAL_BASED_OUTPATIENT_CLINIC_OR_DEPARTMENT_OTHER)
Admission: RE | Disposition: A | Discharge status: Home / Self Care | Payer: Self-pay | Source: Home / Self Care | Attending: Obstetrics & Gynecology

## 2021-01-20 DIAGNOSIS — N939 Abnormal uterine and vaginal bleeding, unspecified: Secondary | ICD-10-CM | POA: Diagnosis present

## 2021-01-20 DIAGNOSIS — N920 Excessive and frequent menstruation with regular cycle: Secondary | ICD-10-CM

## 2021-01-20 DIAGNOSIS — D259 Leiomyoma of uterus, unspecified: Secondary | ICD-10-CM | POA: Insufficient documentation

## 2021-01-20 DIAGNOSIS — D219 Benign neoplasm of connective and other soft tissue, unspecified: Secondary | ICD-10-CM | POA: Diagnosis present

## 2021-01-20 DIAGNOSIS — Z79899 Other long term (current) drug therapy: Secondary | ICD-10-CM | POA: Insufficient documentation

## 2021-01-20 DIAGNOSIS — F319 Bipolar disorder, unspecified: Secondary | ICD-10-CM | POA: Diagnosis present

## 2021-01-20 DIAGNOSIS — D287 Benign neoplasm of other specified female genital organs: Secondary | ICD-10-CM

## 2021-01-20 HISTORY — DX: Anxiety disorder, unspecified: F41.9

## 2021-01-20 HISTORY — DX: Anemia, unspecified: D64.9

## 2021-01-20 HISTORY — DX: Bipolar disorder, unspecified: F31.9

## 2021-01-20 HISTORY — DX: Gastro-esophageal reflux disease without esophagitis: K21.9

## 2021-01-20 HISTORY — PX: DILITATION & CURRETTAGE/HYSTROSCOPY WITH HYDROTHERMAL ABLATION: SHX5570

## 2021-01-20 LAB — POCT PREGNANCY, URINE: Preg Test, Ur: NEGATIVE

## 2021-01-20 SURGERY — DILATATION & CURETTAGE/HYSTEROSCOPY WITH HYDROTHERMAL ABLATION
Anesthesia: General | Site: Vagina

## 2021-01-20 MED ORDER — NAPROXEN 500 MG PO TABS: 500.0000 mg | ORAL_TABLET | Freq: Two times a day (BID) | ORAL | 2 refills | Status: AC

## 2021-01-20 MED ORDER — FENTANYL CITRATE (PF) 100 MCG/2ML IJ SOLN
INTRAMUSCULAR | Status: DC | PRN
  Administered 2021-01-20: 100 ug via INTRAVENOUS

## 2021-01-20 MED ORDER — LACTATED RINGERS IV SOLN: INTRAVENOUS | Status: DC

## 2021-01-20 MED ORDER — BUPIVACAINE HCL (PF) 0.5 % IJ SOLN
INTRAMUSCULAR | Status: AC
  Filled 2021-01-20: qty 30

## 2021-01-20 MED ORDER — ONDANSETRON HCL 4 MG/2ML IJ SOLN
INTRAMUSCULAR | Status: DC | PRN
  Administered 2021-01-20: 4 mg via INTRAVENOUS

## 2021-01-20 MED ORDER — ACETAMINOPHEN 500 MG PO TABS
ORAL_TABLET | ORAL | Status: AC
  Filled 2021-01-20: qty 2

## 2021-01-20 MED ORDER — AMISULPRIDE (ANTIEMETIC) 5 MG/2ML IV SOLN
INTRAVENOUS | Status: AC
  Filled 2021-01-20: qty 2

## 2021-01-20 MED ORDER — PROPOFOL 10 MG/ML IV BOLUS
INTRAVENOUS | Status: DC | PRN
  Administered 2021-01-20: 200 mg via INTRAVENOUS

## 2021-01-20 MED ORDER — PROMETHAZINE HCL 25 MG/ML IJ SOLN
6.2500 mg | INTRAMUSCULAR | Status: DC | PRN
Start: 2021-01-20 — End: 2021-01-20

## 2021-01-20 MED ORDER — SODIUM CHLORIDE 0.9 % IR SOLN
Status: DC | PRN
  Administered 2021-01-20: 3000 mL

## 2021-01-20 MED ORDER — IBUPROFEN 600 MG PO TABS: 600.0000 mg | ORAL_TABLET | Freq: Four times a day (QID) | ORAL | 2 refills | Status: DC | PRN

## 2021-01-20 MED ORDER — DOCUSATE SODIUM 100 MG PO CAPS: 100.0000 mg | ORAL_CAPSULE | Freq: Two times a day (BID) | ORAL | 1 refills | Status: DC | PRN

## 2021-01-20 MED ORDER — ACETAMINOPHEN 500 MG PO TABS
1000.0000 mg | ORAL_TABLET | Freq: Once | ORAL | Status: AC
  Administered 2021-01-20: 1000 mg via ORAL

## 2021-01-20 MED ORDER — FENTANYL CITRATE (PF) 100 MCG/2ML IJ SOLN
INTRAMUSCULAR | Status: AC
  Filled 2021-01-20: qty 2

## 2021-01-20 MED ORDER — KETOROLAC TROMETHAMINE 30 MG/ML IJ SOLN
INTRAMUSCULAR | Status: AC
  Filled 2021-01-20: qty 1

## 2021-01-20 MED ORDER — KETOROLAC TROMETHAMINE 30 MG/ML IJ SOLN: 30.0000 mg | Freq: Once | INTRAMUSCULAR | Status: DC | PRN

## 2021-01-20 MED ORDER — AMISULPRIDE (ANTIEMETIC) 5 MG/2ML IV SOLN: 10.0000 mg | Freq: Once | INTRAVENOUS | Status: DC | PRN

## 2021-01-20 MED ORDER — OXYCODONE HCL 5 MG PO TABS: 5.0000 mg | ORAL_TABLET | Freq: Once | ORAL | Status: DC | PRN

## 2021-01-20 MED ORDER — BUPIVACAINE HCL 0.5 % IJ SOLN
INTRAMUSCULAR | Status: DC | PRN
  Administered 2021-01-20: 30 mL

## 2021-01-20 MED ORDER — SUCCINYLCHOLINE CHLORIDE 200 MG/10ML IV SOSY
PREFILLED_SYRINGE | INTRAVENOUS | Status: AC
  Filled 2021-01-20: qty 10

## 2021-01-20 MED ORDER — FENTANYL CITRATE (PF) 100 MCG/2ML IJ SOLN
25.0000 ug | INTRAMUSCULAR | Status: DC | PRN
Start: 2021-01-20 — End: 2021-01-20
  Administered 2021-01-20 (×3): 50 ug via INTRAVENOUS

## 2021-01-20 MED ORDER — OXYCODONE HCL 5 MG PO TABS: 5.0000 mg | ORAL_TABLET | ORAL | 0 refills | Status: DC | PRN

## 2021-01-20 MED ORDER — EPHEDRINE SULFATE 50 MG/ML IJ SOLN
INTRAMUSCULAR | Status: DC | PRN
  Administered 2021-01-20: 10 mg via INTRAVENOUS

## 2021-01-20 MED ORDER — LIDOCAINE HCL (CARDIAC) PF 100 MG/5ML IV SOSY
PREFILLED_SYRINGE | INTRAVENOUS | Status: DC | PRN
  Administered 2021-01-20: 60 mg via INTRAVENOUS

## 2021-01-20 MED ORDER — KETOROLAC TROMETHAMINE 30 MG/ML IJ SOLN
INTRAMUSCULAR | Status: DC | PRN
  Administered 2021-01-20: 30 mg via INTRAVENOUS

## 2021-01-20 MED ORDER — MIDAZOLAM HCL 2 MG/2ML IJ SOLN
INTRAMUSCULAR | Status: AC
  Filled 2021-01-20: qty 2

## 2021-01-20 MED ORDER — MIDAZOLAM HCL 5 MG/5ML IJ SOLN
INTRAMUSCULAR | Status: DC | PRN
  Administered 2021-01-20: 2 mg via INTRAVENOUS

## 2021-01-20 MED ORDER — OXYCODONE HCL 5 MG/5ML PO SOLN: 5.0000 mg | Freq: Once | ORAL | Status: DC | PRN

## 2021-01-20 MED ORDER — AMISULPRIDE (ANTIEMETIC) 5 MG/2ML IV SOLN
INTRAVENOUS | Status: DC | PRN
  Administered 2021-01-20: 5 mg via INTRAVENOUS

## 2021-01-20 MED ORDER — DOCUSATE SODIUM 100 MG PO CAPS: 100.0000 mg | ORAL_CAPSULE | Freq: Two times a day (BID) | ORAL | 2 refills | Status: DC | PRN

## 2021-01-20 SURGICAL SUPPLY — 25 items
CANISTER SUCT 1200ML W/VALVE (MISCELLANEOUS) ×1 IMPLANT
CATH ROBINSON RED A/P 16FR (CATHETERS) ×3 IMPLANT
CURETTE PIPELLE ENDOMTRL SUCTN (MISCELLANEOUS) ×1 IMPLANT
DEVICE MYOSURE LITE (MISCELLANEOUS) IMPLANT
DEVICE MYOSURE REACH (MISCELLANEOUS) ×2 IMPLANT
GAUZE 4X4 16PLY RFD (DISPOSABLE) ×3 IMPLANT
GLOVE SURG LTX SZ7 (GLOVE) ×5 IMPLANT
GLOVE SURG UNDER POLY LF SZ7 (GLOVE) ×4 IMPLANT
GLOVE SURG UNDER POLY LF SZ7.5 (GLOVE) ×2 IMPLANT
GOWN STRL REUS W/ TWL XL LVL3 (GOWN DISPOSABLE) ×2 IMPLANT
GOWN STRL REUS W/TWL LRG LVL3 (GOWN DISPOSABLE) ×4 IMPLANT
GOWN STRL REUS W/TWL XL LVL3 (GOWN DISPOSABLE) ×6
IV NS IRRIG 3000ML ARTHROMATIC (IV SOLUTION) ×4 IMPLANT
KIT PROCEDURE FLUENT (KITS) ×3 IMPLANT
KIT TURNOVER KIT B (KITS) ×3 IMPLANT
MYOSURE XL FIBROID (MISCELLANEOUS)
PACK VAGINAL MINOR WOMEN LF (CUSTOM PROCEDURE TRAY) ×3 IMPLANT
PAD OB MATERNITY 4.3X12.25 (PERSONAL CARE ITEMS) ×3 IMPLANT
PAD PREP 24X48 CUFFED NSTRL (MISCELLANEOUS) ×3 IMPLANT
PIPELLE ENDOMETRIAL SUCTION CU (MISCELLANEOUS) ×3
SEAL ROD LENS SCOPE MYOSURE (ABLATOR) ×3 IMPLANT
SET GENESYS HTA PROCERVA (MISCELLANEOUS) ×3 IMPLANT
SLEEVE SCD COMPRESS KNEE MED (STOCKING) ×3 IMPLANT
SYSTEM TISS REMOVAL MYOSURE XL (MISCELLANEOUS) IMPLANT
TOWEL GREEN STERILE FF (TOWEL DISPOSABLE) ×4 IMPLANT

## 2021-01-20 NOTE — Op Note (Signed)
PREOPERATIVE DIAGNOSIS:  Abnormal uterine bleeding, fibroids with submucosal component POSTOPERATIVE DIAGNOSIS: The same PROCEDURE: Hysteroscopy, Dilation and Curettage, Myomectomy with Myosure, Hydrothermal Endometrial Ablation SURGEON:  Dr. Verita Schneiders  INDICATIONS: 45 y.o. [name] here for scheduled surgery for abnormal uterine bleeding. Risks of surgery were discussed with the patient including but not limited to: bleeding; infection which may require antibiotics; injury to uterus leading to risk of injury to surrounding intraperitoneal organs, burn injury to vagina or other organs, need for additional procedures including laparoscopy or laparotomy, inability to complete ablation due to uterine or mechanical anomaly, need for trial of another ablation modality, and other postoperative/anesthesia complications.  Patient was informed that there is a high likelihood of success of controlling her symptoms; however about 5% of patients may require further intervention.  Written informed consent was obtained.    FINDINGS:  A 11 week size uterus. Normal ostia bilaterally.  Diffuse proliferative endometrium.  Three submucosal portions of fibroids noted anteriorly, to the right and posterior fundal area. They measured less than 2 cm in diameter.  These were excised using Myosure device.  Normal post ablation appearance of endometrium at end of case.    ANESTHESIA:   General, paracervical block. ESTIMATED BLOOD LOSS:  15 ml SPECIMENS: Endometrial curettings and fibroid fragments sent to pathology COMPLICATIONS:  None immediate.  PROCEDURE DETAILS:  The patient was then taken to the operating room where general anesthesia was administered and was found to be adequate.  After an adequate timeout was performed, she was placed in the dorsal lithotomy position and examined; then prepped and draped in the sterile manner.   Her bladder was catheterized for clear, yellow urine. A speculum was then placed in the  patient's vagina and a single tooth tenaculum was applied to the anterior lip of the cervix.   A paracervical block using 30 ml of 0.5% Marcaine was administered.  The cervix was manually with metal dilators to accommodate the Myosure operative hysteroscope.  Once the cervix was dilated, a gentle curettage was then performed to obtain a moderate amount of endometrial curettings. The Myosure hysteroscope was then inserted under direct visualization using LR as a suspension medium.  The uterine cavity was carefully examined, both ostia were recognized, and diffusely proliferative endometrium with the fibroids above were noted.   These were resected using the Myosure device.  After further careful visualization of the uterine cavity, the hysteroscope was removed under direct visualization.  The hydrothermal ablation hysteroscopic apparatus was then inserted under direct visualization using normal saline as a suspension medium.   The hydrothermal ablation was then carried out as per protocol.   Complete ablation of the endometrium was observed and the hysteroscope was removed under direct visualization.  No complications were observed.  The tenaculum was removed from the anterior lip of the cervix, and the vaginal speculum was removed after noting good hemostasis.  The patient tolerated the procedure well and was taken to the recovery area awake, extubated and in stable condition.  The patient will be discharged to home as per PACU criteria.  Routine postoperative instructions given.  She was prescribed Oxycodone, Naproxen and Colace.  She will follow up in the office in 2-3 weeks  for postoperative evaluation.   Verita Schneiders, MD, [place] Attending [place], [facility]

## 2021-01-20 NOTE — Transfer of Care (Signed)
Immediate Anesthesia Transfer of Care Note  Patient: Samantha Day  Procedure(s) Performed: DILATATION & CURETTAGE/HYSTEROSCOPY WITH HYDROTHERMAL ABLATION AND VAGINAL MYOMECTOMY WITH MYOSURE (N/A Vagina )  Patient Location: PACU  Anesthesia Type:General  Level of Consciousness: sedated  Airway & Oxygen Therapy: Patient connected to nasal cannula oxygen  Post-op Assessment: Report given to RN and Post -op Vital signs reviewed and stable  Post vital signs: Reviewed and stable  Last Vitals:  Vitals Value Taken Time  BP    Temp    Pulse    Resp    SpO2      Last Pain:  Vitals:   01/20/21 1004  TempSrc: Oral  PainSc: 0-No pain         Complications: No complications documented.

## 2021-01-20 NOTE — Discharge Instructions (Signed)
Endometrial Ablation, Care After The following information offers guidance on how to care for yourself after your procedure. Your health care provider may also give you more specific instructions. If you have problems or questions, contact your health care provider. What can I expect after the procedure? After the procedure, it is common to have:  A need to urinate more often than usual for the first 24 hours.  Cramps that feel like menstrual cramps. These may last for 1-2 days.  A thin, watery vaginal discharge that is light pink or brown. This may last for a few weeks. Discharge will be heavy for the first few days after your procedure. You may need to wear a sanitary pad.  Nausea.  Vaginal bleeding for 4-6 weeks after the procedure, as tissue healing occurs. Follow these instructions at home: Medicines  Take over-the-counter and prescription medicines only as told by your health care provider.  If you were prescribed an antibiotic medicine, take it as told by your health care provider. Do not stop taking the antibiotic even if you start to feel better.  Ask your health care provider if the medicine prescribed to you: ? Requires you to avoid driving or using machinery. ? Can cause constipation. You may need to take these actions to prevent or treat constipation:  Drink enough fluid to keep your urine pale yellow.  Take over-the-counter or prescription medicines.  Eat foods that are high in fiber, such as beans, whole grains, and fresh fruits and vegetables.  Limit foods that are high in fat and processed sugars, such as fried or sweet foods.   Activity  If you were given a sedative during the procedure, it can affect you for several hours. Do not drive or operate machinery until your health care provider says that it is safe.  Do not have sex or put anything into your vagina until your health care provider says that it is safe.  Do not lift anything that is heavier than 5 lb  (2.3 kg), or the limit that you are told, until your health care provider says that it is safe.  Return to your normal activities as told by your health care provider. Ask your health care provider what activities are safe for you. General instructions  Do not take baths, swim, or use a hot tub until your health care provider says that it is safe. You will be able to take showers.  Check your vaginal area every day for signs of infection. Check for: ? Redness, swelling, or more pain. ? More blood coming from your vagina. ? A bad-smelling discharge.  Keep all follow-up visits. This is important. Contact a health care provider if:  You have vaginal redness, swelling, or more pain.  You have discharge or bleeding from your vagina that is getting worse.  You have a bad-smelling vaginal discharge.  You have a fever or chills.  You have trouble urinating. Get help right away if:  You have heavy, bright red vaginal bleeding that may include blood clots.  You have severe cramps that do not get better with medicine. Summary  After endometrial ablation, it is normal to have a thin, watery vaginal discharge that is light pink or brown. This may last a few weeks and may be heavier right after the procedure.  Vaginal bleeding is common after the procedure and should get better with time.  Check your vaginal area every day for signs of infection, such as a bad-smelling discharge.  Keep all follow-up   visits. This is important. This information is not intended to replace advice given to you by your health care provider. Make sure you discuss any questions you have with your health care provider. Document Revised: 04/30/2020 Document Reviewed: 04/30/2020 Elsevier Patient Education  [place] Instructions  Activity: Get plenty of rest for the remainder of the day. A responsible individual must stay with you for 24 hours following the procedure.   For the next 24 hours, DO NOT: -Drive a car -Paediatric nurse -Drink alcoholic beverages -Take any medication unless instructed by your physician -Make any legal decisions or sign important papers.  Meals: Start with liquid foods such as gelatin or soup. Progress to regular foods as tolerated. Avoid greasy, spicy, heavy foods. If nausea and/or vomiting occur, drink only clear liquids until the nausea and/or vomiting subsides. Call your physician if vomiting continues.  Special Instructions/Symptoms: Your throat may feel dry or sore from the anesthesia or the breathing tube placed in your throat during surgery. If this causes discomfort, gargle with warm salt water. The discomfort should disappear within 24 hours.  Next dose of Tylenol can be given at 5:30pm if needed. Next dose of NSAID (Ibuprofen/Aleve/Advil) can be given at 8:00pm if needed.

## 2021-01-20 NOTE — Anesthesia Procedure Notes (Signed)
Procedure Name: LMA Insertion Date/Time: 01/20/2021 1:21 PM Performed by: Willa Frater, CRNA Pre-anesthesia Checklist: Patient identified, Emergency Drugs available, Suction available and Patient being monitored Patient Re-evaluated:Patient Re-evaluated prior to induction Oxygen Delivery Method: Circle system utilized Preoxygenation: Pre-oxygenation with 100% oxygen Induction Type: IV induction Ventilation: Mask ventilation without difficulty LMA: LMA inserted LMA Size: 4.0 Number of attempts: 1 Airway Equipment and Method: Bite block Placement Confirmation: positive ETCO2 Tube secured with: Tape Dental Injury: Teeth and Oropharynx as per pre-operative assessment

## 2021-01-20 NOTE — Progress Notes (Signed)
Patient vocalized suicidal ideation during pre op interview. She states her last thought of ending her life was this morning before surgery. Patient denies having any plan or past attempts. She sees a behavior health therapist monthly, and has been having these thoughts for a few months since her heavy bleeding had started. The bleeding had led to hospital admissions for transfusions, and low energy and fatigue. This led to depressive episodes for the patient where not working, feeling tired, and poor finances had led to her suicidal thoughts. Patient states she has a safety plan in place and has phone numbers for suicide hotline, and other resources to reach out to her therapist if the suicidal feelings became more intense. This was brought to clinical nurse specialist of behavior health, Earline Mayotte, who determined patient is not in imminent danger, and the scheduled procedure could ease her symptoms. Reviewed with Dr. Harolyn Rutherford, and Dr. Roanna Banning who are okay to proceed with procedure. Patient signed consent and states she will be safe to go home with her boyfriend after surgery.

## 2021-01-20 NOTE — Anesthesia Postprocedure Evaluation (Signed)
Anesthesia Post Note  Patient: Samantha Day  Procedure(s) Performed: DILATATION & CURETTAGE/HYSTEROSCOPY WITH HYDROTHERMAL ABLATION AND VAGINAL MYOMECTOMY WITH MYOSURE (N/A Vagina )     Patient location during evaluation: PACU Anesthesia Type: General Level of consciousness: awake Pain management: pain level controlled Vital Signs Assessment: post-procedure vital signs reviewed and stable Respiratory status: spontaneous breathing, nonlabored ventilation, respiratory function stable and patient connected to nasal cannula oxygen Cardiovascular status: blood pressure returned to baseline and stable Postop Assessment: no apparent nausea or vomiting Anesthetic complications: no   No complications documented.  Last Vitals:  Vitals:   01/20/21 1522 01/20/21 1549  BP: 135/85 (!) 154/81  Pulse: 67 62  Resp: 14 16  Temp:  36.6 C  SpO2: 100% 100%    Last Pain:  Vitals:   01/20/21 1507  TempSrc:   PainSc: Asleep                 Anetria Harwick P Dashawn Bartnick

## 2021-01-20 NOTE — Anesthesia Preprocedure Evaluation (Addendum)
Anesthesia Evaluation  Patient identified by MRN, date of birth, ID band Patient awake    Reviewed: Allergy & Precautions, NPO status , Patient's Chart, lab work & pertinent test results  Airway Mallampati: II  TM Distance: >3 FB Neck ROM: Full    Dental  (+) Chipped,    Pulmonary neg pulmonary ROS,    Pulmonary exam normal breath sounds clear to auscultation       Cardiovascular negative cardio ROS Normal cardiovascular exam Rhythm:Regular Rate:Normal     Neuro/Psych PSYCHIATRIC DISORDERS Anxiety Depression Bipolar Disorder negative neurological ROS     GI/Hepatic Neg liver ROS, GERD  Medicated and Controlled,  Endo/Other  negative endocrine ROS  Renal/GU negative Renal ROS     Musculoskeletal negative musculoskeletal ROS (+)   Abdominal   Peds  Hematology negative hematology ROS (+)   Anesthesia Other Findings MENORRHAGIA  UTERINE FIBROIDS  Reproductive/Obstetrics S/p BTL                            Anesthesia Physical Anesthesia Plan  ASA: II  Anesthesia Plan: General   Post-op Pain Management:    Induction: Intravenous  PONV Risk Score and Plan: 4 or greater and Ondansetron, Dexamethasone, Midazolam, Amisulpride and Treatment may vary due to age or medical condition  Airway Management Planned: LMA  Additional Equipment:   Intra-op Plan:   Post-operative Plan: Extubation in OR  Informed Consent: I have reviewed the patients History and Physical, chart, labs and discussed the procedure including the risks, benefits and alternatives for the proposed anesthesia with the patient or authorized representative who has indicated his/her understanding and acceptance.     Dental advisory given  Plan Discussed with: CRNA  Anesthesia Plan Comments:         Anesthesia Quick Evaluation

## 2021-01-20 NOTE — H&P (Signed)
Preoperative History and Physical  Samantha Day is a 45 y.o. [phone] here for surgical management of abnormal uterine bleeding, and uterine fibroids with possible submucosal component. Of note, during her preoperative evaluation with RN today, she endorsed being depressed and some suicidal ideation, no plan in place.  [place] on call providers were notified, they felt it was safe to proceed with planned procedure given no current plan, and for her to follow up outpatient.   No other significant preoperative concerns.  Proposed surgery: Hysteroscopy, Dilation and Curettage, Myomectomy with Myosure, Hydrothermal Endometrial Ablation   Past Medical History:  Diagnosis Date  . Anemia   . Anxiety   . Bipolar disorder ([place])   . Blood transfusion without reported diagnosis   . Depression   . GERD (gastroesophageal reflux disease)   . Lab test positive for detection of COVID-19 virus on 11/02/2020 11/02/2020   Past Surgical History:  Procedure Laterality Date  . BREAST SURGERY    . TUBAL LIGATION     OB History  Gravida Para Term Preterm AB Living  4 3 3   1 3   SAB IAB Ectopic Multiple Live Births  1            # Outcome Date GA Lbr Len/2nd Weight Sex Delivery Anes PTL Lv  4 SAB           3 Term      Vag-Spont     2 Term      Vag-Spont     1 Term      Vag-Spont     Patient denies any other pertinent gynecologic issues.   No current facility-administered medications on file prior to encounter.   Current Outpatient Medications on File Prior to Encounter  Medication Sig Dispense Refill  . ferrous sulfate 325 (65 FE) MG tablet Take 325 mg by mouth daily with breakfast.    . naproxen (NAPROSYN) 500 MG tablet Take 1 tablet (500 mg total) by mouth 2 (two) times daily with a meal. As needed for pain 30 tablet 2  . omeprazole (PRILOSEC) 40 MG capsule Take 40 mg by mouth daily.    . polyethylene glycol (MIRALAX / GLYCOLAX) 17 g packet Take 17 g by mouth daily.    . sertraline (ZOLOFT) 50  MG tablet Take 50 mg by mouth daily.     No Known Allergies  Social History:   reports that she has never smoked. She has never used smokeless tobacco. She reports previous alcohol use. She reports that she does not use drugs.  Family History  Problem Relation Age of Onset  . Diabetes Mother   . Parkinson's disease Father   . Lupus Sister     Review of Systems: Pertinent items noted in HPI and remainder of comprehensive ROS otherwise negative.  PHYSICAL EXAM: Blood pressure 119/75, pulse (!) 58, temperature 98 F (36.7 C), temperature source Oral, resp. rate 16, height 5\' 2"  (1.575 m), weight 64.3 kg, last menstrual period 12/16/2020, SpO2 100 %. CONSTITUTIONAL: Well-developed, well-nourished female in no acute distress.  HENT:  Normocephalic, atraumatic, External right and left ear normal. Oropharynx is clear and moist EYES: Conjunctivae and EOM are normal. Pupils are equal, round, and reactive to light. No scleral icterus.  NECK: Normal range of motion, supple, no masses SKIN: Skin is warm and dry. No rash noted. Not diaphoretic. No erythema. No pallor. NEUROLOGIC: Alert and oriented to person, place, and time. Normal reflexes, muscle tone coordination. No cranial nerve deficit  noted. PSYCHIATRIC: Depressed mood and affect. Normal behavior. Normal judgment and thought content. CARDIOVASCULAR: Normal heart rate noted, regular rhythm RESPIRATORY: Effort and breath sounds normal, no problems with respiration noted ABDOMEN: Soft, nontender, nondistended. PELVIC: Deferred MUSCULOSKELETAL: Normal range of motion. No edema and no tenderness. 2+ distal pulses.  Labs: Results for orders placed or performed during the hospital encounter of 01/20/21 (from the past 336 hour(s))  CBC   Collection Time: 01/18/21  1:00 PM  Result Value Ref Range   WBC 6.7 4.0 - 10.5 K/uL   RBC 4.69 3.87 - 5.11 MIL/uL   Hemoglobin 11.6 (L) 12.0 - 15.0 g/dL   HCT 37.1 36.0 - 46.0 %   MCV 79.1 (L) 80.0 -  100.0 fL   MCH 24.7 (L) 26.0 - 34.0 pg   MCHC 31.3 30.0 - 36.0 g/dL   RDW 18.9 (H) 11.5 - 15.5 %   Platelets 278 150 - 400 K/uL   nRBC 0.0 0.0 - 0.2 %  Pregnancy, urine POC   Collection Time: 01/20/21  9:49 AM  Result Value Ref Range   Preg Test, Ur NEGATIVE NEGATIVE    Imaging Studies: US PELVIS TRANSVAGINAL NON-OB (TV ONLY)  Result Date: 09/01/2020 CLINICAL DATA:  Abnormal uterine bleeding. Enlarged uterus on pelvic exam. LMP 08/04/2020. EXAM: ULTRASOUND PELVIS TRANSVAGINAL TECHNIQUE: Transvaginal ultrasound examination of the pelvis was performed including evaluation of the uterus, ovaries, adnexal regions, and pelvic cul-de-sac. COMPARISON:  None. FINDINGS: Uterus Measurements: 10.9 x 6.2 x 9.1 cm = volume: 321 mL. Multiple uterine fibroids are seen, which are submucosal, intramural, and subserosal in location. The 3 largest fibroids measure 4.2 cm, 2.2 cm, and 1.4 cm in maximum diameter. Endometrium Thickness: 19 mm.  No focal abnormality visualized. Right ovary Measurements: 4.0 x 2.3 x 2.7 cm = volume: 12.8 mL. Normal appearance/no adnexal mass. Left ovary Measurements: 3.8 x 1.9 x 1.6 cm = volume: 6.2 mL. Normal appearance/no adnexal mass. Other findings:  No abnormal free fluid IMPRESSION: Multiple uterine fibroids, largest measuring 4.2 cm. Endometrial thickness measures 19 mm. If bleeding remains unresponsive to hormonal or medical therapy, focal lesion work-up with sonohysterogram should be considered. Endometrial biopsy should also be considered in pre-menopausal patients at high risk for endometrial carcinoma. (Ref: Radiological Reasoning: Algorithmic Workup of Abnormal Vaginal Bleeding with Endovaginal Sonography and Sonohysterography. AJR 2008; 856:D14-97) Normal appearance of both ovaries. Electronically Signed   By: Marlaine Hind M.D.   On: 09/01/2020 08:28    Assessment: Patient Active Problem List   Diagnosis Date Noted  . Fibroids 01/20/2021  . Abnormal uterine bleeding  (AUB) 01/20/2021  . Bipolar disorder ([place])   . Lab test positive for detection of COVID-19 virus on 11/02/2020 11/02/2020    Plan: Patient will undergo surgical management with  Hysteroscopy, Dilation and Curettage, Myomectomy with Myosure, Hydrothermal Endometrial Ablation .  The risks of surgery were discussed in detail with the patient including but not limited to: bleeding; infection which may require antibiotics; injury to uterus leading to risk of injury to surrounding intraperitoneal organs, burn injury to vagina or other organs, need for additional procedures including laparoscopy or laparotomy, inability to complete ablation or myomectomy due to uterine or mechanical anomaly/limitation, need for trial of another ablation modality, and other postoperative/anesthesia complications. Patient was informed that there is a high likelihood of success of controlling her symptoms; however about 5% of patients may require further intervention. The postoperative expectations were also discussed in detail. The patient also understands the alternative treatment options which  were discussed in full. The patient concurred with the proposed plan, giving informed written consent for the surgery. Patient has been NPO since last night and she will remain NPO for procedure. Anesthesia and OR aware. To OR when ready.    Verita Schneiders, MD, [place] for [employer], [place]

## 2021-01-21 ENCOUNTER — Encounter (HOSPITAL_BASED_OUTPATIENT_CLINIC_OR_DEPARTMENT_OTHER): Payer: Self-pay | Admitting: Obstetrics & Gynecology

## 2021-01-21 ENCOUNTER — Telehealth: Payer: Self-pay | Admitting: Lactation Services

## 2021-01-21 LAB — SURGICAL PATHOLOGY

## 2021-01-21 NOTE — Telephone Encounter (Signed)
Called patient with results of Benign Endometrial Pathology. Patient voiced understanding. Patient asked about driving, reviewed not driving for about a week or anytime she is taking Oxycodone. [place] office will call with follow up appt at 2 weeks.

## 2021-01-21 NOTE — Progress Notes (Signed)
Called patient today and completed the postoperative follow up phone call from her surgical procedure yesterday. Also discussed with patient her mental and emotional feelings today. Patient stated she was feeling much better today and had experienced no further feelings that she wished to harm herself or end her life. Patient confirmed that she feels safe at this time, and that she understood what resources were available to her if she began to have thoughts or feelings of self-harm. Confirmed that she knows the contact information for her therapist, the suicide hotline, as well as the [facility] and indicated she would definitely contact them if her potentially harmful thoughts returned and began to intensify. Patient denied need for any communication to them at this time.Patient was well-engaged during our phone conversation and able to clearly articulate how she was feeling, both physically and mentally.Reinforced the need for her to contact the provided resources if anything changed.Patient expressed understanding and agreement.

## 2021-01-21 NOTE — Telephone Encounter (Signed)
-----   Message from Osborne Oman, MD sent at 01/21/2021 11:01 AM EDT ----- Benign endometrial pathology after surgery.  Please call to inform patient of results.

## 2021-02-03 ENCOUNTER — Ambulatory Visit: Payer: Self-pay | Admitting: Clinical

## 2021-02-03 DIAGNOSIS — F332 Major depressive disorder, recurrent severe without psychotic features: Secondary | ICD-10-CM

## 2021-02-03 NOTE — Addendum Note (Signed)
Addended by: Vesta Mixer C on: 02/03/2021 03:44 PM   Modules accepted: Orders

## 2021-02-03 NOTE — BH Specialist Note (Addendum)
I reviewed patient visit with the [facility] Intern, and I concur with the treatment plan, as documented in the [facility] Intern note.   No charge for this visit due to [facility] Intern seeing patient.  Vesta Mixer, MSW, [place] for [facility] at [facility] for [place] Follow Up In-Person Visit  MRN: [id number] Name: Samantha Day  Number of [place] Clinician visits: 2/6 Session Start time: 2:45PM Session End time: 3:20PM Total time: 40 minutes  Types of Service: Individual psychotherapy  Interpretor:No. Interpretor Name and Language: N/a  Subjective: Samantha Day is a 45 y.o. female  Patient was referred by Verita Schneiders, MD for positive depression. Patient reports the following symptoms/concerns: feeling tired, hard to get out of bed since quitting job, shortness of breath, palpitations while still working, thoughts of "I feel so tired that I want to stop breathing." Denied current plan. Duration of problem: 2 years; Severity of problem: severe  Objective: Mood: Depressed and Affect: Appropriate Risk of harm to self or others: Suicidal ideation No plan to harm self or others  Life Context: Family and Social: Pt lives with son School/Work: Pt is currently unemployed due to previous injury Self-Care: Pt has family who checks in on her Life Changes: Recent surgery  Patient and/or Family's Strengths/Protective Factors: Social connections and [place] supports in place (healthy food, safe environments, etc.)  Goals Addressed: Patient will: 1.  Reduce symptoms of: anxiety and depression  2.  Increase knowledge and/or ability of: coping skills and stress reduction  3.  Demonstrate ability to: Increase adequate support systems for patient/family  Progress towards Goals: Ongoing  Interventions: Interventions utilized:  Mindfulness or Relaxation Training and Supportive  Counseling Standardized Assessments completed: GAD-7 and PHQ 9  Patient and/or Family Response: Pt expressed understanding and willingness to adhere to treatment plan.  Patient Centered Plan: Patient is on the following Treatment Plan(s): IBH Assessment: Patient currently experiencing Major depressive disorder.   Patient may benefit from continuing to practice copings skills and identifying social supports. Patient expressed interest in psychiatry.  Plan: 1. Follow up with behavioral health clinician on : 02/18/2021 at 2:15PM 2. Behavioral recommendations: continue practice deep breathing, practice grounding techniques, reach out to sponsor for support, identify people in her life that support her 3. Referral(s): [place] (In Clinic) and Psychiatrist 4. "From scale of 1-10, how likely are you to follow plan?": [place] (Supervisor: Vesta Mixer)

## 2021-02-04 NOTE — BH Specialist Note (Deleted)
Integrated Behavioral Health via Telemedicine Visit  02/04/2021 Samantha Day [id number]  Number of [place] visits: *** Session Start time: ***  Session End time: *** Total time: {IBH Total YSAY:30160109}  Referring Provider: *** Patient/Family location: Home*** [facility] Provider location:[facility] for [employer] [facility] at [facility]  All persons participating in visit: Patient *** and [place] ***  Types of Service: {CHL AMB TYPE OF SERVICE:[phone]}  I connected with [name] and/or Armando Reichert Yarde's {family members:20773} via  Telephone or Video Enabled Telemedicine Application  (Video is Caregility application) and verified that I am speaking with the correct person using two identifiers. Discussed confidentiality: {YES/NO:21197}  I discussed the limitations of telemedicine and the availability of in person appointments.  Discussed there is a possibility of technology failure and discussed alternative modes of communication if that failure occurs.  I discussed that engaging in this telemedicine visit, they consent to the provision of behavioral healthcare and the services will be billed under their insurance.  Patient and/or legal guardian expressed understanding and consented to Telemedicine visit: {YES/NO:21197}  Presenting Concerns: Patient and/or family reports the following symptoms/concerns: *** Duration of problem: ***; Severity of problem: {Mild/Moderate/Severe:20260}  Patient and/or Family's Strengths/Protective Factors: {CHL AMB BH PROTECTIVE FACTORS:5304070027}  Goals Addressed: Patient will: 1.  Reduce symptoms of: {IBH Symptoms:21014056}  2.  Increase knowledge and/or ability of: {IBH Patient Tools:21014057}  3.  Demonstrate ability to: {IBH Goals:21014053}  Progress towards Goals: {CHL AMB BH PROGRESS TOWARDS GOALS:[phone]}  Interventions: Interventions utilized:  {IBH  Interventions:21014054} Standardized Assessments completed: {IBH Screening Tools:21014051}  Patient and/or Family Response: ***  Assessment: Patient currently experiencing ***.   Patient may benefit from ***.  Plan: 1. Follow up with behavioral health clinician on : *** 2. Behavioral recommendations: *** 3. Referral(s): {IBH Referrals:21014055}  I discussed the assessment and treatment plan with the patient and/or parent/guardian. They were provided an opportunity to ask questions and all were answered. They agreed with the plan and demonstrated an understanding of the instructions.   They were advised to call back or seek an in-person evaluation if the symptoms worsen or if the condition fails to improve as anticipated.  Caroleen Hamman Samuel Rittenhouse, LCSW

## 2021-02-09 ENCOUNTER — Ambulatory Visit: Payer: No Typology Code available for payment source | Admitting: Obstetrics & Gynecology

## 2021-02-12 ENCOUNTER — Telehealth: Payer: Self-pay | Admitting: *Deleted

## 2021-02-12 NOTE — Telephone Encounter (Signed)
Pt left VM message stating that she would like to speak with a nurse regarding getting an appt. She is having vaginal discharge, pelvic pain and lower abdominal pain. I called pt and stated that we can see her for nurse visit next week to check on the vaginal discharge. She will most likely need additional future appointment with a provider to address her pelvic and abdominal pain. She voiced understanding. I offered appt on 4/25 @ 1020 which she accepted.

## 2021-02-15 ENCOUNTER — Ambulatory Visit (INDEPENDENT_AMBULATORY_CARE_PROVIDER_SITE_OTHER): Payer: Self-pay | Admitting: *Deleted

## 2021-02-15 ENCOUNTER — Other Ambulatory Visit (HOSPITAL_COMMUNITY)
Admission: RE | Admit: 2021-02-15 | Discharge: 2021-02-15 | Disposition: A | Discharge status: Home / Self Care | Payer: No Typology Code available for payment source | Source: Ambulatory Visit | Attending: Family Medicine | Admitting: Family Medicine

## 2021-02-15 ENCOUNTER — Encounter: Payer: Self-pay | Admitting: *Deleted

## 2021-02-15 ENCOUNTER — Other Ambulatory Visit: Payer: Self-pay

## 2021-02-15 VITALS — BP 135/83 | HR 68 | Ht 62.0 in | Wt 140.1 lb

## 2021-02-15 DIAGNOSIS — M545 Low back pain, unspecified: Secondary | ICD-10-CM

## 2021-02-15 DIAGNOSIS — N898 Other specified noninflammatory disorders of vagina: Secondary | ICD-10-CM

## 2021-02-15 LAB — POCT URINALYSIS DIP (DEVICE)
Bilirubin Urine: NEGATIVE
Glucose, UA: NEGATIVE mg/dL
Hgb urine dipstick: NEGATIVE
Ketones, ur: NEGATIVE mg/dL
Leukocytes,Ua: NEGATIVE
Nitrite: NEGATIVE
Protein, ur: NEGATIVE mg/dL
Specific Gravity, Urine: 1.015 (ref 1.005–1.030)
Urobilinogen, UA: 0.2 mg/dL (ref 0.0–1.0)
pH: 6 (ref 5.0–8.0)

## 2021-02-15 NOTE — Progress Notes (Signed)
Pt reports brown vaginal discharge x1 week. She also has itching and irritation. In addition, pt c/o back pain and RLQ pelvic pain x2 weeks. This pain started after surgery from 3/30. She has been taking naproxen as prescribed  for the pain. Pt was advised that she may try a heating pad to her back for additional relief. Pt reports recent constipation and has been taking Miralax daily however is not taking Colace. I advised pt to take Colace as prescribed and taper Miralax to every other Shaqueena Mauceri. Once constipation has improved, she may reduce frequency of Miralax however continue to take Colace. She denies painful urination. Random urine specimen obtained for urinalysis and was negative for infection indicators. Self swab was obtained for wet prep and STI's. Pt was informed that she will be notified of test results as well as treatment indicated, if any via Mychart. Pt will reschedule post op appt w/Dr. Harolyn Rutherford. Per review, pt had elevated PHQ-9 and GAD 7 scores today. She has scheduled appt on 4/28 for [facility]. Pt voiced understanding of all information and instructions given. She had documented food insecurity on her questionnaire and was escorted to the [employer] upon discharge.

## 2021-02-16 LAB — CERVICOVAGINAL ANCILLARY ONLY
Bacterial Vaginitis (gardnerella): NEGATIVE
Candida Glabrata: NEGATIVE
Candida Vaginitis: NEGATIVE
Chlamydia: NEGATIVE
Comment: NEGATIVE
Comment: NEGATIVE
Comment: NEGATIVE
Comment: NEGATIVE
Comment: NEGATIVE
Comment: NORMAL
Neisseria Gonorrhea: NEGATIVE
Trichomonas: NEGATIVE

## 2021-02-16 NOTE — Progress Notes (Signed)
Patient was assessed and managed by nursing staff during this encounter. I have reviewed the chart and agree with the documentation and plan. I have also made any necessary editorial changes.  Verita Schneiders, MD 02/16/2021 8:20 AM

## 2021-03-17 ENCOUNTER — Ambulatory Visit (INDEPENDENT_AMBULATORY_CARE_PROVIDER_SITE_OTHER): Payer: No Typology Code available for payment source | Admitting: Clinical

## 2021-03-17 ENCOUNTER — Other Ambulatory Visit: Payer: Self-pay

## 2021-03-17 ENCOUNTER — Ambulatory Visit (INDEPENDENT_AMBULATORY_CARE_PROVIDER_SITE_OTHER): Payer: Self-pay | Admitting: Obstetrics & Gynecology

## 2021-03-17 ENCOUNTER — Encounter: Payer: Self-pay | Admitting: Obstetrics & Gynecology

## 2021-03-17 VITALS — BP 106/81 | HR 72 | Ht 62.0 in | Wt 146.2 lb

## 2021-03-17 DIAGNOSIS — Z1331 Encounter for screening for depression: Secondary | ICD-10-CM

## 2021-03-17 DIAGNOSIS — Z09 Encounter for follow-up examination after completed treatment for conditions other than malignant neoplasm: Secondary | ICD-10-CM

## 2021-03-17 DIAGNOSIS — Z9889 Other specified postprocedural states: Secondary | ICD-10-CM

## 2021-03-17 DIAGNOSIS — R45851 Suicidal ideations: Secondary | ICD-10-CM

## 2021-03-17 DIAGNOSIS — F332 Major depressive disorder, recurrent severe without psychotic features: Secondary | ICD-10-CM

## 2021-03-17 NOTE — BH Specialist Note (Addendum)
Integrated Behavioral Health Follow Up In-Person Visit  MRN: [id number] Name: Samantha Day  Number of [place] Clinician visits: 1/6 Session Start time: 3:43  Session End time: 4:30 Total time: 47 minutes  Types of Service: Individual psychotherapy  Interpretor:No. Interpretor Name and Language: n/a  Subjective: Samantha Day is a 45 y.o. female accompanied by n/a Patient was referred by Verita Schneiders, MD for positive depression screen. Patient reports the following symptoms/concerns: Pt states her primary concern today is feeling very tired, overwhelmed with daily housework, migraines ("topamax isn't working") thoughts of harm to self due to exhaustion; pt says no intent to harm and denies plan to harm herself, wants to be here for her children, "I promise I will not hurt myself and yes, please call me tomorrow".   Duration of problem: Ongoing; Severity of problem: severe  Objective: Mood: Depressed and Affect: Depressed and Tearful Risk of harm to self or others: Suicidal ideation No plan to harm self or others  Life Context: Family and Social: Pt has two children(19yo son; 15yo daughter) School/Work: Unemployed Self-Care: - Life Changes: Continued health issues  Patient and/or Family's Strengths/Protective Factors: Sense of purpose  Goals Addressed: Patient will: 1.  Reduce symptoms of: depression  2.  Demonstrate ability to: Increase motivation to adhere to plan of care  Progress towards Goals: Ongoing  Interventions: Interventions utilized:  Safety Standardized Assessments completed: GAD-7 and PHQ 9  Patient and/or Family Response: Pt agrees to treatment plan  Patient Centered Plan: Patient is on the following Treatment Plan(s): IBH Assessment: Patient currently experiencing Major depressive disorder, recurrent, severe, without psychotic features.   Patient may benefit from psychoeducation and brief therapeutic interventions  regarding coping with symptoms of depression .  Plan: 1. Follow up with behavioral health clinician on : Roselyn Reef will call tomorrow, 03/18/21 2. Behavioral recommendations:  -Follow Safety Plan; [facility] walk-in 24/7 if SI returns -Continue to attend initial appointment at [facility] on 04/07/21 (May be seen earlier, walk-in between 7:15am-7:30am M-TH, first-come, first-served) -Talk to children today about needing a break from cooking dinner one night/week for the next two weeks; consider using paper plates only for one week for additional break  -Consider requesting referral to neurology via PCP for migraines -Take bag from [employer] today 3. Referral(s): Integrated Orthoptist (In Clinic) and [place] (LME/Outside Clinic)  Garlan Fair, [place]  Depression screen [facility] 2/9 03/17/2021 02/15/2021 02/03/2021 10/07/2020 08/18/2020  Decreased Interest 2 2 1 1 1   Down, Depressed, Hopeless 2 1 2 1 1   PHQ - 2 Score 4 3 3 2 2   Altered sleeping 2 3 3 1 1   Tired, decreased energy 2 3 3 1 1   Change in appetite 2 3 2 1 1   Feeling bad or failure about yourself  2 2 3 1 2   Trouble concentrating 2 2 3 1 1   Moving slowly or fidgety/restless 2 2 0 1 0  Suicidal thoughts 2 1 1 1  0  PHQ-9 Score 18 19 18 9 8   Difficult doing work/chores Very difficult - - - -   GAD 7 : Generalized Anxiety Score 03/17/2021 02/15/2021 02/03/2021 10/07/2020  Nervous, Anxious, on Edge 2 2 3 2   Control/stop worrying 2 2 1 2   Worry too much - different things 1 2 1 2   Trouble relaxing 1 2 2 2   Restless 1 2 0 2  Easily annoyed or irritable 2 2 2 2   Afraid - awful might  happen 1 2 1 2   Total GAD 7 Score 10 14 10  14

## 2021-03-17 NOTE — Progress Notes (Signed)
    GYNECOLOGY POSTOPERATIVE VISIT  Subjective:     Samantha Day is a 44 y.o. female who presents to the clinic weeks status post Hysteroscopy, Dilation and Curettage, Myomectomy with Myosure, Hydrothermal Endometrial Ablation on 01/20/2021 for abnormal uterine bleeding, fibroids with submucosal component. Eating a regular diet without difficulty. Bowel movements are abnormal with constipation. Pain is controlled without any medications.  On her depression screening, she scored 18 on PHQ-9 and endorsed having suicidal thoughts and plans in the last two weeks. GAD score 10.  The following portions of the patient's history were reviewed and updated as appropriate: allergies, current medications, past family history, past medical history, past social history, past surgical history and problem list.  ASCUS pap with negative HRHPV on 06/23/2020, normal mammogram on 06/23/2021.   Review of Systems Pertinent items noted in HPI and remainder of comprehensive ROS otherwise negative.    Objective:    BP 106/81   Pulse 72   Ht 5\' 2"  (1.575 m)   Wt 146 lb 3.2 oz (66.3 kg)   LMP 12/16/2020   BMI 26.74 kg/m  General:  alert and no distress  Abdomen: Soft, no masses seen.  Psych:   depressed affect, very tearful   01/20/2021 Surgical Pathology ENDOMETRIUM, CURETTAGE:  - Polypoid fragments of secretory endometrium.  - No hyperplasia or malignancy.   Assessment:    Postoperative course complicated by depression/suicidal thoughts; but no operative/gynecologic concerns. Operative findings again reviewed. Pathology report discussed. [facility] Clinician immediately notified of positive depression screen and suicidal thoughts.    Plan:     Patient verbally consented to [facility] services about presenting concerns and psychiatric consultation as appropriate.  Will follow up recommendations of [facility] clinician with regards to her depression and suicidal thoughts. Very concerned about her!  No current gynecologic concerns. Recommended OTC remedies for constipation.    I spent 10 minutes dedicated to the care of this patient including pre-visit review of records, face to face time with the patient discussing her conditions and results.   Verita Schneiders, MD, [place] for [employer], [place]

## 2021-03-17 NOTE — Patient Instructions (Signed)

## 2021-03-18 ENCOUNTER — Telehealth: Payer: Self-pay | Admitting: Clinical

## 2021-03-18 NOTE — Telephone Encounter (Signed)
Follow-up call, as agreed-upon by pt and [place]; pt is feeling better than yesterday, mood is improved, family is helpful/supportive, and pt agrees to follow previous plan to attend initial appointment at [facility]; will call Roselyn Reef at [phone] as needed prior to scheduled appointment with [facility].

## 2021-04-07 ENCOUNTER — Ambulatory Visit (INDEPENDENT_AMBULATORY_CARE_PROVIDER_SITE_OTHER): Payer: No Payment, Other | Admitting: Licensed Clinical Social Worker

## 2021-04-07 ENCOUNTER — Other Ambulatory Visit: Payer: Self-pay

## 2021-04-07 DIAGNOSIS — F418 Other specified anxiety disorders: Secondary | ICD-10-CM
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
## 2021-05-13 ENCOUNTER — Other Ambulatory Visit: Payer: Self-pay

## 2021-05-13 DIAGNOSIS — Z1231 Encounter for screening mammogram for malignant neoplasm of breast: Secondary | ICD-10-CM

## 2021-05-28 ENCOUNTER — Telehealth: Payer: Self-pay | Admitting: *Deleted

## 2021-05-28 NOTE — Telephone Encounter (Signed)
Received a voicemail from 05/27/21 am stating she is Marliss Coots, FNP with [facility] of the [place]. States have mutual patient. States she saw her yesterday and having same symptoms  as before surgery of bleeding, passing clots and discharge. States she told her she would call our office and have [place] call her about what needs to be done.  Rashon Westrup,RN

## 2021-05-31 NOTE — Telephone Encounter (Signed)
Called patient in regards to concerns with bleeding, passing clots, and discharge.   Patient reports since surgery she was having light brown discharge with some spotting also until last week.   She saw Dr. Harolyn Rutherford in May. Last week patient was having lower abdominal and back pain. She was experiencing heavier bright red bleeding with clots, not as heavy as before the surgery, lasting 3 days. She reports she was changing her pad every 4 hours, not saturated.   She reports she is still having constant pain to her lower back and on her right lower side of her abdomen in the area of her ovary.   She takes Tylenol 1-2 times a day for the abdominal pain and Excedrine as needed for headache, it dulls the pain, does not take it away.   Patient reports she is stooling once a day or every other day. She has stopped the stool softeners or Miralax. She is trying to eat more fiber in foods. She sometimes still struggles with constipation.   Will forward to Dr. Harolyn Rutherford for advisement.

## 2021-06-01 NOTE — Telephone Encounter (Signed)
Spoke with Dr. Harolyn Rutherford who reports that the bleeding was most likely a period and that brown discharge is common. She reports patient can be seen in the office for reevaluation if patient prefers.   Called and spoke with patient, she would like to be seen for evaluation. Message to front office to call and schedule her at patients request.

## 2021-06-09 ENCOUNTER — Other Ambulatory Visit: Payer: Self-pay

## 2021-06-09 ENCOUNTER — Ambulatory Visit (INDEPENDENT_AMBULATORY_CARE_PROVIDER_SITE_OTHER): Payer: No Payment, Other | Admitting: Psychiatry

## 2021-06-09 ENCOUNTER — Encounter (HOSPITAL_COMMUNITY): Payer: Self-pay | Admitting: Psychiatry

## 2021-06-09 VITALS — BP 126/83 | HR 57 | Ht 62.0 in | Wt 149.0 lb

## 2021-06-09 DIAGNOSIS — F411 Generalized anxiety disorder: Secondary | ICD-10-CM | POA: Diagnosis not present

## 2021-06-09 DIAGNOSIS — F3162 Bipolar disorder, current episode mixed, moderate: Secondary | ICD-10-CM | POA: Insufficient documentation

## 2021-06-09 MED ORDER — TRAZODONE HCL 50 MG PO TABS: 50.0000 mg | ORAL_TABLET | Freq: Every evening | ORAL | 2 refills | Status: DC | PRN

## 2021-06-09 MED ORDER — CAPLYTA 42 MG PO CAPS
42.0000 mg | ORAL_CAPSULE | Freq: Every day | ORAL | 2 refills | Status: DC
  Filled 2021-06-09: qty 30, 30d supply, fill #0

## 2021-06-09 MED ORDER — SERTRALINE HCL 50 MG PO TABS: 50.0000 mg | ORAL_TABLET | Freq: Every day | ORAL | 2 refills | Status: DC
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
## 2021-06-10 ENCOUNTER — Other Ambulatory Visit: Payer: Self-pay

## 2021-06-10 ENCOUNTER — Ambulatory Visit (INDEPENDENT_AMBULATORY_CARE_PROVIDER_SITE_OTHER): Payer: No Payment, Other | Admitting: Licensed Clinical Social Worker

## 2021-06-10 DIAGNOSIS — F418 Other specified anxiety disorders: Secondary | ICD-10-CM
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
## 2021-06-23 ENCOUNTER — Other Ambulatory Visit: Payer: Self-pay | Admitting: Obstetrics and Gynecology

## 2021-06-23 DIAGNOSIS — Z1231 Encounter for screening mammogram for malignant neoplasm of breast: Secondary | ICD-10-CM

## 2021-06-24 ENCOUNTER — Other Ambulatory Visit: Payer: Self-pay

## 2021-06-24 ENCOUNTER — Ambulatory Visit (INDEPENDENT_AMBULATORY_CARE_PROVIDER_SITE_OTHER): Payer: No Payment, Other | Admitting: Licensed Clinical Social Worker

## 2021-06-24 DIAGNOSIS — F3162 Bipolar disorder, current episode mixed, moderate: Secondary | ICD-10-CM

## 2021-06-29 ENCOUNTER — Other Ambulatory Visit: Payer: Self-pay

## 2021-06-29 ENCOUNTER — Ambulatory Visit
Admission: RE | Admit: 2021-06-29 | Discharge: 2021-06-29 | Disposition: A | Discharge status: Home / Self Care | Payer: No Typology Code available for payment source | Source: Ambulatory Visit | Attending: *Deleted | Admitting: *Deleted

## 2021-06-29 ENCOUNTER — Ambulatory Visit: Payer: Self-pay | Admitting: *Deleted

## 2021-06-29 VITALS — BP 110/70 | Wt 144.7 lb

## 2021-06-29 DIAGNOSIS — Z01419 Encounter for gynecological examination (general) (routine) without abnormal findings: Secondary | ICD-10-CM

## 2021-06-29 NOTE — Patient Instructions (Addendum)
Explained breast self awareness with [name]. Pap smear completed today. Let patient know that her next Pap smear will be due based on the result of today's Pap smear. Referred patient to the [facility] for a screening mammogram on mobile unit. Appointment scheduled Tuesday, June 29, 2021 at 1550. Patient escorted to the mobile unit following BCCCP appointment for her screening mammogram. Let patient know will follow up with her within the next couple weeks with results of her Pap smear by phone. Informed patient that the [facility] will follow-up with her within the next couple of weeks with results of her mammogram by letter or phone. [name] verbalized understanding.  Waunita Sandstrom, Arvil Chaco, RN 3:30 PM

## 2021-06-29 NOTE — Progress Notes (Signed)
[name] is a 45 y.o. female who presents to [facility] clinic today with complaint of bilateral diffuse breast pain that comes and goes. Patient rates the pain at a 3 out of 10.    Pap Smear: Pap not smear completed today. Last Pap smear was 06/23/2020 at [facility] clinic and was abnormal - ASCUS with negative HPV . Per ASCCP guidelines Pap and co-testing one year recommended for follow-up. Patient stated she has a history of two other abnormal Pap smears. The first abnormal was around 2005 or 2006 that a colposcopy was completed for follow-up. 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 Patient stated she has had at least three normal Pap smears since last colposcopy prior to most recent Pap smear. Last Pap smear result is not available in Epic.   Physical exam: Breasts Breasts symmetrical. No skin abnormalities left breast. Observed a scar on the right outer breast that per patient had a lumpectomy completed in 2011 due to recurrent mastitis. No nipple retraction bilateral breasts. No nipple discharge bilateral breasts. No lymphadenopathy. No lumps palpated bilateral breasts. Complaints of right outer breast tenderness on exam.      MS DIGITAL SCREENING TOMO BILATERAL  Result Date: 06/25/2020 CLINICAL DATA:  Screening. EXAM: DIGITAL SCREENING BILATERAL MAMMOGRAM WITH TOMO AND CAD COMPARISON:  Previous exam(s). ACR Breast Density Category c: The breast tissue is heterogeneously dense, which may obscure small masses. FINDINGS: There are no findings suspicious for malignancy. Images were processed with CAD. IMPRESSION: No mammographic evidence of malignancy. A result letter of this screening mammogram will be mailed directly to the patient. RECOMMENDATION: Screening mammogram in one year. (Code:[id number]) BI-RADS CATEGORY  1: Negative. Electronically Signed   By: Ammie Ferrier M.D.   On: 06/25/2020 13:05       Pelvic/Bimanual Ext  Genitalia No lesions, no swelling and no discharge observed on external genitalia.        Vagina Vagina pink and normal texture. No lesions or discharge observed in vagina.        Cervix Cervix is present. Cervix pink and of normal texture. No discharge observed.    Uterus Uterus is present and palpable. Uterus in normal position and normal size.        Adnexae Bilateral ovaries present and palpable. Complaints of right adnexa tenderness on exam. Patient stated she has history of ovarian cysts. Advised patient to follow-up with her provider.       Rectovaginal No rectal exam completed today since patient had no rectal complaints. No skin abnormalities observed on exam.     Smoking History: Patient is a former smoker that quit when she was 45 years old.   Patient Navigation: Patient education provided. Access to services provided for patient through [facility] program.   Colorectal Cancer Screening: Per patient has never had colonoscopy completed. FIT Test given to patient to complete. No complaints today.    Breast and Cervical Cancer Risk Assessment: Patient does not have family history of breast cancer, known genetic mutations, or radiation treatment to the chest before age 20. Per patient has history of cervical dysplasia. Patient has no history of being immunocompromised or DES exposure in-utero.  Risk Assessment     Risk Scores       06/29/2021 06/23/2020   Last edited by: Demetrius Revel, LPN McGill, Sherie Mamie Nick, LPN   5-year risk: 0.6 % 0.6 %   Lifetime risk: 6.5 % 6.6 %  A: BCCCP exam with pap smear Complaint of bilateral diffuse breast pain.  P: Referred patient to the [place] for a screening mammogram on mobile unit. Appointment scheduled Tuesday, June 29, 2021 at 1550.   Samantha Parish, RN 06/29/2021 3:30 PM

## 2021-06-30 NOTE — Progress Notes (Signed)
   THERAPIST PROGRESS NOTE  Session Time: 45 min  Participation Level: Active  Behavioral Response: CasualAlertDepressed  Type of Therapy: Individual Therapy  Treatment Goals addressed: Communication: dep/anx/coping  Interventions: CBT, Solution Focused, Supportive, and Reframing  Summary: Samantha Day is a 45 y.o. female who presents with hx of dep/anx/bipolar dis. This date pt continues to express S&S of dep/anx. She states she is still taking meds and not quite as drowsy but has low, slow speech flow. Pt states she is not always consistent with taking meds. She agrees to set alarm on phone for better consistency. Pt advises she has been distracted with her dtrs MH and an argument dtr got into at school. She provides details and reports ongoing fam dynamics with spouse who does not believe in [place] issues. She states spouse does not believe dep/anx are real but used as an excuse to be "lazy". Pt reports mood fluctuations exacerbated by relationship with spouse who criticizes not only her but children. Pt states she even had fleeting thoughts of hurting spouse last wk when he was yelling and she was chopping vegetables with a knife in hand. Pt states instead she walked out of the house and prayed, did deep breathing to gain control. She denies HI at present. Pt admits she too is continuing to have episodes of yelling with the children, which she feels badly about. Pt did try to ration supplies with autistic child. She states "he is not happy about it". Validated change not being easy but to remain consistent allowing for expected adjustment period. Pt agrees. LCSW assessed for status of abd pain pt reported last session. Pt states she has fibroid tumors and may need to have a hysterectomy. She has f/u with gyn. Pt states she did read self talk lit but there is not adequate time to address today, plan for next session. Encouraged use of established positive coping skills. LCSW reviewed poc including  scheduling prior to close of session. Pt states appreciation for care.     Suicidal/Homicidal: Nowithout intent/plan  Therapist Response: Pt remains receptive to care.  Plan: Return again in 4 weeks.  Diagnosis: Axis I: Bipolar, mixed  [name], LCSW 06/30/2021

## 2021-07-07 ENCOUNTER — Other Ambulatory Visit: Payer: Self-pay

## 2021-07-09 ENCOUNTER — Telehealth (HOSPITAL_COMMUNITY): Payer: Self-pay | Admitting: *Deleted

## 2021-07-09 NOTE — Telephone Encounter (Signed)
VM for writer asking to speak with her dr, Dr Ronne Binning re a medicine she recommended to her a month ago. At the time patient called Dr Ronne Binning had gone home for the day. Will forward request to be addressed next week.

## 2021-07-12 ENCOUNTER — Other Ambulatory Visit (HOSPITAL_COMMUNITY): Payer: Self-pay | Admitting: Psychiatry

## 2021-07-12 DIAGNOSIS — F3162 Bipolar disorder, current episode mixed, moderate: Secondary | ICD-10-CM

## 2021-07-12 MED ORDER — ARIPIPRAZOLE 5 MG PO TABS: 5.0000 mg | ORAL_TABLET | Freq: Every day | ORAL | 3 refills | Status: DC

## 2021-07-12 NOTE — Telephone Encounter (Signed)
Patient informed writer that since starting Caplyta she has had more GI upset.  She notes that she continues to be nauseous and has vomited on several occasions.  She informed Probation officer that she discussed this with her primary care doctor and she was placed on Prilosec however reports that it was ineffective.  She informed Probation officer that because of her increased GI upset she becomes short of breath and feels like she cannot breathe.  At this time she request to discontinue Caplyta.  She will start Abilify 5 mg to help manage mood. Potential side effects of medication and risks vs benefits of treatment vs non-treatment were explained and discussed. All questions were answered.  Patient notes that she has been more irritable, distractible, and having racing thoughts.  She denies other symptoms of mania.  No other concerns noticed time.

## 2021-07-13 LAB — CYTOLOGY - PAP
Comment: NEGATIVE
Comment: NEGATIVE
HPV 16: NEGATIVE
HPV 18 / 45: NEGATIVE
High risk HPV: POSITIVE — AB

## 2021-07-14 ENCOUNTER — Telehealth: Payer: Self-pay

## 2021-07-14 NOTE — Telephone Encounter (Signed)
Via Lavon Paganini, Spanish Interpreter [facility]), Patient informed abnormal pap results, abnormal cells, (LSIL/HPV+), needs colposcopy. Patient verbalized understanding. Referral sent to [facility].

## 2021-07-27 ENCOUNTER — Other Ambulatory Visit: Payer: Self-pay

## 2021-07-27 ENCOUNTER — Encounter (HOSPITAL_COMMUNITY): Payer: Self-pay

## 2021-07-27 ENCOUNTER — Telehealth (HOSPITAL_COMMUNITY): Payer: Self-pay | Admitting: Psychiatry

## 2021-07-27 ENCOUNTER — Ambulatory Visit (HOSPITAL_COMMUNITY): Payer: No Payment, Other | Admitting: Licensed Clinical Social Worker

## 2021-07-27 ENCOUNTER — Other Ambulatory Visit (HOSPITAL_COMMUNITY): Payer: Self-pay | Admitting: Psychiatry

## 2021-07-27 DIAGNOSIS — F3162 Bipolar disorder, current episode mixed, moderate: Secondary | ICD-10-CM

## 2021-07-27 MED ORDER — ARIPIPRAZOLE 5 MG PO TABS
5.0000 mg | ORAL_TABLET | Freq: Every day | ORAL | 3 refills | Status: DC
  Filled 2021-07-27: qty 30, 30d supply, fill #0

## 2021-07-27 NOTE — Telephone Encounter (Signed)
No action needed.  Confirmed Abilify can be transferred from Health Dept to [place]. Per pt, health dept does not have any abilify.

## 2021-07-30 ENCOUNTER — Telehealth: Payer: Self-pay

## 2021-07-30 NOTE — Telephone Encounter (Signed)
Called patient via Lavon Paganini to give patient colpo appointment information. Patient is scheduled with the [facility] for [facility] on 09/13/21 @ 8:35 am. Patient voiced understanding.

## 2021-08-03 ENCOUNTER — Other Ambulatory Visit: Payer: Self-pay

## 2021-08-24 ENCOUNTER — Other Ambulatory Visit: Payer: Self-pay

## 2021-08-24 ENCOUNTER — Ambulatory Visit (INDEPENDENT_AMBULATORY_CARE_PROVIDER_SITE_OTHER): Payer: No Payment, Other | Admitting: Licensed Clinical Social Worker

## 2021-08-24 DIAGNOSIS — F3162 Bipolar disorder, current episode mixed, moderate: Secondary | ICD-10-CM

## 2021-08-25 NOTE — Progress Notes (Signed)
   THERAPIST PROGRESS NOTE  Session Time: 45 min  Participation Level: Active  Behavioral Response: CasualAlertAnxious and Depressed  Type of Therapy: Individual Therapy  Treatment Goals addressed: Communication: dep/anx/coping  Interventions: Solution Focused and Supportive  Summary: SIGNE TACKITT is a 45 y.o. female who presents with hx of bipolar dis, dep, anx.  Today patient returns for in person session.  When asked how she has been since last session patient states "good".  Patient reports she is taking meds as prescribed but has run out of her trazodone.  She is reminded of next medication management appointment on November 17 and encouraged to address sleep and trazodone at that time.  Patient reports fatigue and ongoing signs and symptoms of depression with irritability.  Patient reports ongoing physical health problems, particularly GYN issues.  Patient reports she had a biopsy today and should have results in approximately 1 week.  Patient reports her 73 year old autistic child has developed a new obsession around gasoline and oil he sees in parking lots and on pavement.  This has exacerbated his obsession with cleanliness and patient experiencing extra stress trying to cope with her sons disabilities.  LCSW assessed for patient's understanding of where her son is on the autism spectrum.  Patient admits she knows little about autism.  LCSW provided referrals to multiple resources related to autism.  Encourage patient to get as informed as possible as she may be over explaining situations to her son that he cannot process thus frustrating him and her.  Additional assessment of relationship with patient's boyfriend reveals ongoing conflict.  Today patient reports she has been sleeping on the sofa in the living room since March of this year.  Her boyfriend continues to decline counseling.  Patient advises he recently mentioned marriage and she declined.  Patient states she feels like she  "will just leave one day".  She denies suicidal ideation.  Patient reports ongoing stress related to legal status in the [place] and being unemployed.  LCSW provided referral to the women's resource center which patient will try to go to when she is in [place] for her November 17 medication management appointment.  LCSW encouraged pt to develop a plan for separation if that is in fact what she feels would be best for her and her children. Will continue to address relationship issues/needs in future sessions. LCSW reviewed coping strategies. Pt declines journaling stating she is fearful someone might find what she writes and read it. Deep breathing and meditation, walking in nature encouraged. LCSW reviewed poc including scheduling prior to close of session. Pt states appreciation for care.   Suicidal/Homicidal: Nowithout intent/plan  Therapist Response: Pt receptive to care.  Plan: Return again for next avail appt.  Diagnosis: Axis I: Bipolar, mixed  [name], LCSW 08/25/2021

## 2021-09-09 ENCOUNTER — Encounter (HOSPITAL_COMMUNITY): Payer: No Payment, Other | Admitting: Psychiatry

## 2021-09-09 ENCOUNTER — Telehealth (HOSPITAL_COMMUNITY): Payer: Self-pay | Admitting: Psychiatry

## 2021-09-09 ENCOUNTER — Other Ambulatory Visit (HOSPITAL_COMMUNITY): Payer: Self-pay | Admitting: Psychiatry

## 2021-09-09 DIAGNOSIS — F411 Generalized anxiety disorder: Secondary | ICD-10-CM

## 2021-09-09 DIAGNOSIS — F3162 Bipolar disorder, current episode mixed, moderate: Secondary | ICD-10-CM

## 2021-09-09 MED ORDER — SERTRALINE HCL 50 MG PO TABS
50.0000 mg | ORAL_TABLET | Freq: Every day | ORAL | 3 refills | Status: DC
  Filled 2021-09-09: qty 30, 30d supply, fill #0

## 2021-09-09 MED ORDER — TRAZODONE HCL 50 MG PO TABS
50.0000 mg | ORAL_TABLET | Freq: Every evening | ORAL | 3 refills | Status: DC | PRN
  Filled 2021-09-09: qty 30, 30d supply, fill #0

## 2021-09-09 MED ORDER — ARIPIPRAZOLE 5 MG PO TABS
5.0000 mg | ORAL_TABLET | Freq: Every day | ORAL | 3 refills | Status: DC
  Filled 2021-09-09: qty 30, 30d supply, fill #0

## 2021-09-09 NOTE — Telephone Encounter (Signed)
Medications refilled and sent to preferred pharmacy.

## 2021-09-09 NOTE — Telephone Encounter (Signed)
Patient called for REFILL: TRAZODONE  50 MG at bedtime, any other ready for refill Pharmacy :  [facility] Patient phone number: Last seen:  August 2022 COMMENTS:   Rescheduled 09/09/21 to new appt on 10/06/21 due to having a migraine

## 2021-09-10 ENCOUNTER — Other Ambulatory Visit: Payer: Self-pay

## 2021-09-13 ENCOUNTER — Ambulatory Visit: Payer: No Typology Code available for payment source | Admitting: Obstetrics and Gynecology

## 2021-09-21 ENCOUNTER — Other Ambulatory Visit: Payer: Self-pay

## 2021-10-06 ENCOUNTER — Encounter (HOSPITAL_COMMUNITY): Payer: Self-pay | Admitting: Psychiatry

## 2021-10-06 ENCOUNTER — Other Ambulatory Visit: Payer: Self-pay

## 2021-10-06 ENCOUNTER — Ambulatory Visit (INDEPENDENT_AMBULATORY_CARE_PROVIDER_SITE_OTHER): Payer: No Payment, Other | Admitting: Psychiatry

## 2021-10-06 DIAGNOSIS — F411 Generalized anxiety disorder: Secondary | ICD-10-CM | POA: Diagnosis not present

## 2021-10-06 DIAGNOSIS — F3162 Bipolar disorder, current episode mixed, moderate: Secondary | ICD-10-CM

## 2021-10-06 MED ORDER — TRAZODONE HCL 50 MG PO TABS
50.0000 mg | ORAL_TABLET | Freq: Every evening | ORAL | 3 refills | Status: DC | PRN
  Filled 2021-10-06: qty 30, 30d supply, fill #0

## 2021-10-06 MED ORDER — SERTRALINE HCL 50 MG PO TABS
50.0000 mg | ORAL_TABLET | Freq: Every day | ORAL | 3 refills | Status: DC
  Filled 2021-10-06: qty 30, 30d supply, fill #0

## 2021-10-06 MED ORDER — ARIPIPRAZOLE 5 MG PO TABS
5.0000 mg | ORAL_TABLET | Freq: Every day | ORAL | 3 refills | Status: DC
  Filled 2021-10-06 – 2021-10-27 (×2): qty 30, 30d supply, fill #0
  Filled 2021-12-01 – 2021-12-03 (×2): qty 30, 30d supply, fill #1
  Filled 2022-01-04: qty 30, 30d supply, fill #2
  Filled 2022-01-11: qty 30, 30d supply, fill #0

## 2021-10-06 NOTE — Progress Notes (Signed)
BH MD/PA/NP OP Progress Note  10/06/2021 11:22 AM Samantha Day  MRN:  [id number]  Chief Complaint: " I was never able to start Abilify" Chief Complaint   Medication Management    HPI: 45 year old female seen today for follow up psychiatric evaluation.  She has a psychiatric history of bipolar disorder, anxiety, and depression.  Currently she is managed on Zoloft 50 mg, trazodone 50 mg nightly as needed, and Abilify 5 mg daily.  She notes her medications are somewhat effective in managing her psychiatric conditions.   Today she is well-groomed, pleasant, cooperative, engaged in conversation, maintained eye contact.  She informed Probation officer that she was not able to start Abilify as the pharmacy would not fill it.  She informed Probation officer that she continues to be irritable, distractible, having fluctuations in mood, and racing thoughts.  She also informed Probation officer that she has been more anxious and depressed about her children, her finances, her health, and how her health may affect working.  She continues to have a work visa but notes that she is fearful of going to work because she fears she will lose her job. Patient informed Probation officer that she cares for her 36 year old autistic son and her 22 daughter.  She notes that they are both struggling with their mental health.  Provider conducted a GAD-7 and patient scored a 14.  Provider also conducted a PHQ-9 and patient scored and 11.  She endorses passive SI however notes that she would not harm herself.   Today she denies SI/HI/VAH or paranoia.  She notes that her appetite fluctuates but informed writer that she has been maintaining her weight.  She notes for the last 2 nights she has been able to sleep 8 hours however reports that her sleep was poor over the last 2 weeks.   Patient informed Probation officer that she continues to do suffer from migraines.  She reports that she was given Topamax as well as another medication and notes that it is somewhat effective at  managing her pain.    Today patient given 10 mg sample of Abilify to help manage mood.  Patient instructed to cut the medication in half which she was agreeable to.  A prescription of Abilify 5 mg was sent to preferred pharmacy.  She we will continue her other medications as prescribed and follow-up with [facility] [place] for therapy.  No other concerns at this time.    Visit Diagnosis:    ICD-10-CM   1. Bipolar 1 disorder, mixed, moderate (HCC)  F31.62 ARIPiprazole (ABILIFY) 5 MG tablet    sertraline (ZOLOFT) 50 MG tablet    traZODone (DESYREL) 50 MG tablet    2. Generalized anxiety disorder  F41.1 sertraline (ZOLOFT) 50 MG tablet      Past Psychiatric History: Bipolar disorder, anxiety, and depression  Past Medical History:  Past Medical History:  Diagnosis Date   Anemia    Anxiety    Bipolar disorder ([place])    Blood transfusion without reported diagnosis    Depression    GERD (gastroesophageal reflux disease)    Lab test positive for detection of COVID-19 virus on 11/02/2020 11/02/2020    Past Surgical History:  Procedure Laterality Date   BREAST BIOPSY Right 2011   BREAST SURGERY     DILITATION & CURRETTAGE/HYSTROSCOPY WITH HYDROTHERMAL ABLATION N/A 01/20/2021   Procedure: DILATATION & CURETTAGE/HYSTEROSCOPY WITH HYDROTHERMAL ABLATION AND VAGINAL MYOMECTOMY WITH MYOSURE;  Surgeon: Osborne Oman, MD;  Location: [facility];  Service:  Gynecology;  Laterality: N/A;   TUBAL LIGATION      Family Psychiatric History: 16 year old son autism, notes sisters have undiagnosed anxiety and depression, and father alcohol use  Family History:  Family History  Problem Relation Age of Onset   Diabetes Mother    Parkinson's disease Father    Lupus Sister     Social History:  Social History   Socioeconomic History   Marital status: Single    Spouse name: Not on file   Number of children: 3   Years of education: Not on file   Highest education  level: High school graduate  Occupational History   Not on file  Tobacco Use   Smoking status: Never   Smokeless tobacco: Never  Vaping Use   Vaping Use: Never used  Substance and Sexual Activity   Alcohol use: Yes    Comment: occasionally   Drug use: Never   Sexual activity: Yes    Birth control/protection: Surgical  Other Topics Concern   Not on file  Social History Narrative   Not on file   Social Determinants of Health   Financial Resource Strain: Not on file  Food Insecurity: Food Insecurity Present   Worried About [place] in the Last Year: Sometimes true   [place] in the Last Year: Sometimes true  Transportation Needs: No Transportation Needs   Lack of Transportation (Medical): No   Lack of Transportation (Non-Medical): No  Physical Activity: Not on file  Stress: Not on file  Social Connections: Not on file    Allergies: No Known Allergies  Metabolic Disorder Labs: No results found for: HGBA1C, MPG No results found for: PROLACTIN No results found for: CHOL, TRIG, HDL, CHOLHDL, VLDL, LDLCALC Lab Results  Component Value Date   TSH 2.200 08/18/2020    Therapeutic Level Labs: No results found for: LITHIUM No results found for: VALPROATE No components found for:  CBMZ  Current Medications: Current Outpatient Medications  Medication Sig Dispense Refill   docusate sodium (COLACE) 100 MG capsule Take 1 capsule (100 mg total) by mouth 2 (two) times daily as needed for mild constipation or moderate constipation. 30 capsule 1   ferrous sulfate 325 (65 FE) MG tablet Take 325 mg by mouth daily with breakfast.     loratadine (CLARITIN) 10 MG tablet Take 10 mg by mouth daily.     naproxen (NAPROSYN) 500 MG tablet Take 1 tablet (500 mg total) by mouth 2 (two) times daily with a meal. As needed for pain 30 tablet 2   omeprazole (PRILOSEC) 40 MG capsule Take 40 mg by mouth daily.     oxyCODONE (OXY IR/ROXICODONE) 5 MG immediate release tablet Take 1  tablet (5 mg total) by mouth every 4 (four) hours as needed for severe pain or breakthrough pain. 20 tablet 0   polyethylene glycol (MIRALAX / GLYCOLAX) 17 g packet Take 17 g by mouth daily.     ARIPiprazole (ABILIFY) 5 MG tablet Take 1 tablet (5 mg total) by mouth daily. 30 tablet 3   sertraline (ZOLOFT) 50 MG tablet Take 1 tablet (50 mg total) by mouth daily. 30 tablet 3   traZODone (DESYREL) 50 MG tablet Take 1 tablet (50 mg total) by mouth at bedtime as needed for sleep. 30 tablet 3   No current facility-administered medications for this visit.     Musculoskeletal: Strength & Muscle Tone: within normal limits Gait & Station: normal Patient leans: N/A  Psychiatric Specialty Exam: Review  of Systems  Blood pressure 109/83, pulse 68, height 5\' 2"  (1.575 m), weight 143 lb (64.9 kg).Body mass index is 26.16 kg/m.  General Appearance: Well Groomed  Eye Contact:  Good  Speech:  Clear and Coherent and Normal Rate  Volume:  Normal  Mood:  Anxious and Depressed  Affect:  Appropriate and Congruent  Thought Process:  Coherent, Goal Directed, and Linear  Orientation:  Full (Time, Place, and Person)  Thought Content: WDL and Logical   Suicidal Thoughts:  No  Homicidal Thoughts:  No  Memory:  Immediate;   Good Recent;   Good Remote;   Good  Judgement:  Good  Insight:  Good  Psychomotor Activity:  Normal  Concentration:  Concentration: Good and Attention Span: Good  Recall:  Good  Fund of Knowledge: Good  Language: Good  Akathisia:  No  Handed:  Right  AIMS (if indicated): not done  Assets:  Communication Skills Desire for Improvement Financial Resources/Insurance Housing Physical Health Social Support  ADL's:  Intact  Cognition: WNL  Sleep:  Good   Screenings: GAD-7    Flowsheet Row Clinical Support from 10/06/2021 in [facility] Office Visit from 06/09/2021 in [facility] Office Visit from 03/17/2021 in [place] for  [place] at [facility] for [facility] Clinical Support from 02/15/2021 in Center for [employer] at [employer] for [place] from 02/03/2021 in [place] for [place] at [facility] for Women  Total GAD-7 Score 14 13 10 14 10       PHQ2-9    Flowsheet Row Clinical Support from 10/06/2021 in [facility] Office Visit from 06/09/2021 in [facility] Counselor from 04/07/2021 in [facility] Office Visit from 03/17/2021 in [place] for [place] at [facility] for Women Clinical Support from 02/15/2021 in Center for [place] at [facility] for Women  PHQ-2 Total Score 2 4 4 4 3   PHQ-9 Total Score 11 18 20 18 19       Flowsheet Row Clinical Support from 10/06/2021 in [facility] Office Visit from 06/09/2021 in [facility] Counselor from 04/07/2021 in [place] Error: Q7 should not be populated when Q6 is No Error: Q7 should not be populated when Q6 is No High Risk        Assessment and Plan: Patient endorses symptoms of hypomania, anxiety, and depression.  She notes that she never received her Abilify as her pharmacy would not fill it. Today patient given 10 mg sample of Abilify to help manage mood.  Patient instructed to cut the medication in half which she was agreeable to.  A prescription of Abilify 5 mg was sent to preferred pharmacy.  She we will continue her other medications as prescribed.  1. Bipolar 1 disorder, mixed, moderate (HCC)  Start- ARIPiprazole (ABILIFY) 5 MG tablet; Take 1 tablet (5 mg total) by mouth daily.  Dispense: 30 tablet; Refill: 3 Continue- sertraline (ZOLOFT) 50 MG tablet; Take 1 tablet (50 mg total) by mouth daily.  Dispense: 30 tablet; Refill: 3 Continue- traZODone  (DESYREL) 50 MG tablet; Take 1 tablet (50 mg total) by mouth at bedtime as needed for sleep.  Dispense: 30 tablet; Refill: 3  2. Generalized anxiety disorder  Continue- sertraline (ZOLOFT) 50 MG tablet; Take 1 tablet (50 mg total) by mouth daily.  Dispense: 30  tablet; Refill: 3   Follow-up in 67-month Follow-up with therapy Salley Slaughter, NP 10/06/2021, 11:22 AM

## 2021-10-11 ENCOUNTER — Ambulatory Visit: Payer: No Typology Code available for payment source | Admitting: Obstetrics and Gynecology

## 2021-10-11 ENCOUNTER — Telehealth: Payer: Self-pay

## 2021-10-11 NOTE — Telephone Encounter (Signed)
Called pt to follow up on missed appt this AM for colposcopy. Patient states she had colposcopy completed at [facility]. Per chart review this was completed in November 2022. Patient encouraged to follow up with our office as needed.

## 2021-10-13 ENCOUNTER — Other Ambulatory Visit: Payer: Self-pay

## 2021-10-21 ENCOUNTER — Other Ambulatory Visit: Payer: Self-pay

## 2021-10-27 ENCOUNTER — Other Ambulatory Visit (HOSPITAL_COMMUNITY): Payer: Self-pay

## 2021-10-27 ENCOUNTER — Ambulatory Visit (INDEPENDENT_AMBULATORY_CARE_PROVIDER_SITE_OTHER): Payer: No Payment, Other | Admitting: Licensed Clinical Social Worker

## 2021-10-27 ENCOUNTER — Other Ambulatory Visit: Payer: Self-pay

## 2021-10-27 DIAGNOSIS — F3162 Bipolar disorder, current episode mixed, moderate: Secondary | ICD-10-CM

## 2021-10-27 NOTE — Progress Notes (Signed)
° °  THERAPIST PROGRESS NOTE  Session Time: 42 min  Participation Level: Active  Behavioral Response: CasualLethargicDepressed and Dysphoric  Type of Therapy: Individual Therapy  Treatment Goals addressed: Communication: dep/anx/coping/exercise  Interventions: Motivational Interviewing, Solution Focused, and Supportive  Summary: Samantha Day is a 46 y.o. female who presents with hx of Bipolar Dis.  Today patient returns for in person session.  Last session completed was 08/24/21.  Patient is noted to be in a low mood with a flat affect.  When asked how she has been since last session, patient states "good".  Patient states "I am trying to be more positive".  Patient reports she is taking medications as prescribed at present but was out for 2 weeks recently.  As session continues patient is negative and tearful.  Patient reports she is "so tired of trying" when it comes to her immigration status, work status, and dealing with relationship.  Patient provides details on "bad experiences".  LCSW provided education on realistic expectations and personal efforts toward change.  Assessment reveals patient failed to follow-up on autism education and resources for help with son, women's resource center, but has been exercising more regularly.  LCSW provided education on benefits of connecting with resources. Pt will consider stopping at [employer] today post session. LCSW addressed coping strategies. LCSW reviewed poc including scheduling prior to close of session. Pt states appreciation for care.   Suicidal/Homicidal: Nowithout intent/plan  Therapist Response: Pt somewhat receptive to care.  Plan: Return again in 4 weeks.  Diagnosis: Axis I: Bipolar, mixed  [name], LCSW 10/27/2021

## 2021-11-25 ENCOUNTER — Ambulatory Visit (HOSPITAL_COMMUNITY): Payer: No Payment, Other | Admitting: Licensed Clinical Social Worker

## 2021-12-01 ENCOUNTER — Ambulatory Visit (INDEPENDENT_AMBULATORY_CARE_PROVIDER_SITE_OTHER): Payer: No Payment, Other | Admitting: Licensed Clinical Social Worker

## 2021-12-01 ENCOUNTER — Other Ambulatory Visit (HOSPITAL_COMMUNITY): Payer: Self-pay

## 2021-12-01 DIAGNOSIS — F3162 Bipolar disorder, current episode mixed, moderate: Secondary | ICD-10-CM | POA: Diagnosis not present

## 2021-12-02 NOTE — Progress Notes (Signed)
THERAPIST PROGRESS NOTE   Virtual Visit via Telephone Note  I connected with Samantha Day on 12/02/21 at  4:00 PM EST by telephone and verified that I am speaking with the correct person using two identifiers.  Location: Patient: Home Provider: [facility]   I discussed the limitations, risks, security and privacy concerns of performing an evaluation and management service by telephone and the availability of in person appointments. I also discussed with the patient that there may be a patient responsible charge related to this service. The patient expressed understanding and agreed to proceed. I discussed the assessment and treatment plan with the patient. The patient was provided an opportunity to ask questions and all were answered. The patient agreed with the plan and demonstrated an understanding of the instructions.   The patient was advised to call back or seek an in-person evaluation if the symptoms worsen or if the condition fails to improve as anticipated.  I provided 25 minutes of non-face-to-face time during this encounter.  Participation Level: Active  Behavioral Response:  UTAAlertDepressed  Type of Therapy: Individual Therapy  Treatment Goals addressed: Communication: dep/anx/coping  Interventions: Solution Focused and Supportive  Summary: Samantha Day is a 46 y.o. female who presents with hx of Bipolar Dis.  Today patient answers her phone for telephone session.  When asked, patient reports it would be helpful to have her appointments by phone since she drives from [facility] and has limited finances.  Patient reports she is just getting home from [place].  She states she went to see a lawyer to see if she could get help for her son and was advised she could represent him herself.  LCSW assessed for whether or not patient followed up on information provided to her by this clinician regarding autism advocacy and support.  Patient states she spoke with someone  briefly from autism [facility] who gave her a number to call for the closest office to her which was in [place].  Patient reports she did not call and no longer has the number.  LCSW looks up information for autism speaks while patient is on the phone.  LCSW provided phone number for patient to call autism speaks and for information regarding the autism Society of [place].  Patient advised she has written the information down and plans to follow-up in a timely manner.  Patient states she did go to the [employer] center when she left this clinician's office last visit on January 4.  Patient states she went back again yesterday.  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 She also plans to take some computer classes there. Pt commends her for following up.  Patient reports she did speak to attorney regarding her legal status.  Patient reports she is going to be required to get a physical yet the physical must be done by one of their doctors and will cost anywhere from $4-$500.  Patient states she is certain she will not be able to use her primary care doctor for this and is going to start saving money in order to get the matter resolved. Pt continues to be frustrated yet is progressing on process steps. Pt continues to exercise 3-4 times per wk. She advises her PCP told her gyn is recommending she have a hysterectomy d/t excessive bleeding with anemia and fibroids. Pt advises she will not have the finances to do this any time soon. Pt taking meds as prescribed.  She denies other new worries/concerns this date. LCSW reviewed poc including scheduling prior to close of session. Pt states appreciation for care.   Suicidal/Homicidal: Nowithout intent/plan  Therapist Response: Pt receptive to care.  Plan: Return again in ~4 weeks.  Diagnosis: Axis I: Bipolar, mixed  [name], LCSW 12/02/2021

## 2021-12-03 ENCOUNTER — Other Ambulatory Visit (HOSPITAL_COMMUNITY): Payer: Self-pay

## 2021-12-03 ENCOUNTER — Other Ambulatory Visit: Payer: Self-pay

## 2021-12-23 ENCOUNTER — Encounter (HOSPITAL_COMMUNITY): Payer: No Payment, Other | Admitting: Psychiatry

## 2021-12-28 ENCOUNTER — Encounter (HOSPITAL_COMMUNITY): Payer: Self-pay

## 2021-12-28 ENCOUNTER — Telehealth (HOSPITAL_COMMUNITY): Payer: No Payment, Other | Admitting: Psychiatry

## 2021-12-28 NOTE — Progress Notes (Deleted)
[place] MD/PA/NP OP Progress Note ? ?12/28/2021 5:02 PM ?Samantha Day  ?MRN:  [id number] ? ?Chief Complaint: No chief complaint on file. ? ?HPI: *** ?Visit Diagnosis: No diagnosis found. ? ?Past Psychiatric History: *** ? ?Past Medical History:  ?Past Medical History:  ?Diagnosis Date  ? Anemia   ? Anxiety   ? Bipolar disorder ([place])   ? Blood transfusion without reported diagnosis   ? Depression   ? GERD (gastroesophageal reflux disease)   ? Lab test positive for detection of COVID-19 virus on 11/02/2020 11/02/2020  ?  ?Past Surgical History:  ?Procedure Laterality Date  ? BREAST BIOPSY Right 2011  ? BREAST SURGERY    ? DILITATION & CURRETTAGE/HYSTROSCOPY WITH HYDROTHERMAL ABLATION N/A 01/20/2021  ? Procedure: DILATATION & CURETTAGE/HYSTEROSCOPY WITH HYDROTHERMAL ABLATION AND VAGINAL MYOMECTOMY WITH MYOSURE;  Surgeon: Osborne Oman, MD;  Location: [place];  Service: Gynecology;  Laterality: N/A;  ? TUBAL LIGATION    ? ? ?Family Psychiatric History: *** ? ?Family History:  ?Family History  ?Problem Relation Age of Onset  ? Diabetes Mother   ? Parkinson's disease Father   ? Lupus Sister   ? ? ?Social History:  ?Social History  ? ?Socioeconomic History  ? Marital status: Single  ?  Spouse name: Not on file  ? Number of children: 3  ? Years of education: Not on file  ? Highest education level: High school graduate  ?Occupational History  ? Not on file  ?Tobacco Use  ? Smoking status: Never  ? Smokeless tobacco: Never  ?Vaping Use  ? Vaping Use: Never used  ?Substance and Sexual Activity  ? Alcohol use: Yes  ?  Comment: occasionally  ? Drug use: Never  ? Sexual activity: Yes  ?  Birth control/protection: Surgical  ?Other Topics Concern  ? Not on file  ?Social History Narrative  ? Not on file  ? ?Social Determinants of Health  ? ?Financial Resource Strain: Not on file  ?Food Insecurity: Food Insecurity Present  ? Worried About Charity fundraiser in the Last Year: Sometimes true  ? Ran Out of Food in the  Last Year: Sometimes true  ?Transportation Needs: No Transportation Needs  ? Lack of Transportation (Medical): No  ? Lack of Transportation (Non-Medical): No  ?Physical Activity: Not on file  ?Stress: Not on file  ?Social Connections: Not on file  ? ? ?Allergies: No Known Allergies ? ?Metabolic Disorder Labs: ?No results found for: HGBA1C, MPG ?No results found for: PROLACTIN ?No results found for: CHOL, TRIG, HDL, CHOLHDL, VLDL, LDLCALC ?Lab Results  ?Component Value Date  ? TSH 2.200 08/18/2020  ? ? ?Therapeutic Level Labs: ?No results found for: LITHIUM ?No results found for: VALPROATE ?No components found for:  CBMZ ? ?Current Medications: ?Current Outpatient Medications  ?Medication Sig Dispense Refill  ? ARIPiprazole (ABILIFY) 5 MG tablet Take 1 tablet (5 mg total) by mouth daily. 30 tablet 3  ? docusate sodium (COLACE) 100 MG capsule Take 1 capsule (100 mg total) by mouth 2 (two) times daily as needed for mild constipation or moderate constipation. 30 capsule 1  ? ferrous sulfate 325 (65 FE) MG tablet Take 325 mg by mouth daily with breakfast.    ? loratadine (CLARITIN) 10 MG tablet Take 10 mg by mouth daily.    ? naproxen (NAPROSYN) 500 MG tablet Take 1 tablet (500 mg total) by mouth 2 (two) times daily with a meal. As needed for pain 30 tablet 2  ?  omeprazole (PRILOSEC) 40 MG capsule Take 40 mg by mouth daily.    ? oxyCODONE (OXY IR/ROXICODONE) 5 MG immediate release tablet Take 1 tablet (5 mg total) by mouth every 4 (four) hours as needed for severe pain or breakthrough pain. 20 tablet 0  ? polyethylene glycol (MIRALAX / GLYCOLAX) 17 g packet Take 17 g by mouth daily.    ? sertraline (ZOLOFT) 50 MG tablet Take 1 tablet (50 mg total) by mouth daily. 30 tablet 3  ? traZODone (DESYREL) 50 MG tablet Take 1 tablet (50 mg total) by mouth at bedtime as needed for sleep. 30 tablet 3  ? ?No current facility-administered medications for this visit.  ? ? ? ?Musculoskeletal: ?Strength & Muscle Tone: {desc; muscle  tone:32375} ?Gait & Station: {PE GAIT ED NATL:22525} ?Patient leans: {Patient Leans:21022755} ? ?Psychiatric Specialty Exam: ?Review of Systems  ?All other systems reviewed and are negative.  ?There were no vitals taken for this visit.There is no height or weight on file to calculate BMI.  ?General Appearance: {Appearance:22683}  ?Eye Contact:  {BHH EYE CONTACT:22684}  ?Speech:  {Speech:22685}  ?Volume:  {Volume (PAA):22686}  ?Mood:  {BHH MOOD:22306}  ?Affect:  {Affect (PAA):22687}  ?Thought Process:  {Thought Process (PAA):22688}  ?Orientation:  {BHH ORIENTATION (PAA):22689}  ?Thought Content: {Thought Content:22690}   ?Suicidal Thoughts:  {ST/HT (PAA):22692}  ?Homicidal Thoughts:  {ST/HT (PAA):22692}  ?Memory:  {BHH RUEAVW:09811}  ?Judgement:  {Judgement (PAA):22694}  ?Insight:  {Insight (PAA):22695}  ?Psychomotor Activity:  {Psychomotor (PAA):22696}  ?Concentration:  {Concentration:21399}  ?Recall:  {BHH GOOD/FAIR/POOR:22877}  ?Fund of Knowledge: {BHH GOOD/FAIR/POOR:22877}  ?Language: {BHH GOOD/FAIR/POOR:22877}  ?Akathisia:  {BHH YES OR NO:22294}  ?Handed:  {Handed:22697}  ?AIMS (if indicated): {Desc; done/not:10129}  ?Assets:  {Assets (PAA):22698}  ?ADL's:  {BHH BJY'N:82956}  ?Cognition: {chl bhh cognition:304700322}  ?Sleep:  {BHH GOOD/FAIR/POOR:22877}  ? ?Screenings: ?GAD-7   ? ?Flowsheet Row Clinical Support from 10/06/2021 in [facility] Office Visit from 06/09/2021 in [facility] Office Visit from 03/17/2021 in Center for [place] at [facility] for Women Clinical Support from 02/15/2021 in [place] for [place] at [facility] for [place] from 02/03/2021 in Center for [place] at [facility] for Women  ?Total GAD-7 Score '14 13 10 14 10  '$ ? ?  ? ?PHQ2-9   ? ?Flowsheet Row Clinical Support from 10/06/2021 in [facility] Office Visit from  06/09/2021 in [facility] Counselor from 04/07/2021 in [facility] Office Visit from 03/17/2021 in [place] for [place] at [facility] for Women Clinical Support from 02/15/2021 in Center for [place] at [facility] for Women  ?PHQ-2 Total Score '2 4 4 4 3  '$ ?PHQ-9 Total Score '11 18 20 18 19  '$ ? ?  ? ?Flowsheet Row Clinical Support from 10/06/2021 in [facility] Office Visit from 06/09/2021 in [facility] Counselor from 04/07/2021 in [facility]  ?C-SSRS RISK CATEGORY Error: Q7 should not be populated when Q6 is No Error: Q7 should not be populated when Q6 is No High Risk  ? ?  ? ? ? ?Assessment and Plan: *** ? ?Collaboration of Care: Collaboration of Care: {BH OP Collaboration of Care:21014065} ? ?Patient/Guardian was advised Release of Information must be obtained prior to any record release in order to collaborate their care with an outside provider. Patient/Guardian was advised if they  have not already done so to contact the registration department to sign all necessary forms in order for [place] to release information regarding their care.  ? ?Consent: Patient/Guardian gives verbal consent for treatment and assignment of benefits for services provided during this visit. Patient/Guardian expressed understanding and agreed to proceed.  ? ? ?Franne Grip, NP ?12/28/2021, 5:02 PM ? ?

## 2022-01-05 ENCOUNTER — Other Ambulatory Visit (HOSPITAL_COMMUNITY): Payer: Self-pay

## 2022-01-11 ENCOUNTER — Other Ambulatory Visit (HOSPITAL_COMMUNITY): Payer: Self-pay

## 2022-01-11 ENCOUNTER — Other Ambulatory Visit: Payer: Self-pay

## 2022-01-13 ENCOUNTER — Telehealth (INDEPENDENT_AMBULATORY_CARE_PROVIDER_SITE_OTHER): Payer: No Payment, Other | Admitting: Psychiatry

## 2022-01-13 DIAGNOSIS — F3162 Bipolar disorder, current episode mixed, moderate: Secondary | ICD-10-CM | POA: Diagnosis not present

## 2022-01-13 DIAGNOSIS — F411 Generalized anxiety disorder: Secondary | ICD-10-CM | POA: Diagnosis not present

## 2022-01-13 MED ORDER — ARIPIPRAZOLE 5 MG PO TABS
5.0000 mg | ORAL_TABLET | Freq: Every day | ORAL | 3 refills | Status: DC
  Filled 2022-01-13 – 2022-03-11 (×3): qty 30, 30d supply, fill #0

## 2022-01-13 MED ORDER — SERTRALINE HCL 50 MG PO TABS
75.0000 mg | ORAL_TABLET | Freq: Every day | ORAL | 2 refills | Status: DC
  Filled 2022-01-13: qty 45, 30d supply, fill #0

## 2022-01-13 NOTE — Progress Notes (Signed)
BH MD/PA/NP OP Progress Note ? ?01/13/2022 5:32 PM ?Samantha Day  ?MRN:  [id number] ? ?Virtual Visit via Video Note ? ?I connected with Samantha Day on 01/13/22 at  5:00 PM EDT by a video enabled telemedicine application and verified that I am speaking with the correct person using two identifiers. ? ?Location: ?Patient: home ?Provider: offsite ?  ?I discussed the limitations of evaluation and management by telemedicine and the availability of in person appointments. The patient expressed understanding and agreed to proceed. ? ?  ?I discussed the assessment and treatment plan with the patient. The patient was provided an opportunity to ask questions and all were answered. The patient agreed with the plan and demonstrated an understanding of the instructions. ?  ?The patient was advised to call back or seek an in-person evaluation if the symptoms worsen or if the condition fails to improve as anticipated. ? ?I provided 10 minutes of non-face-to-face time during this encounter. ? ? ?Franne Grip, NP  ? ?Chief Complaint: Medication management ? ?HPI: Manasi Dishon is a 46 year old female presenting to [facility] behavioral health outpatient for a follow-up psychiatric evaluation.  She reports a psychiatric history of bipolar disorder and generalized anxiety disorder.  Her symptoms are managed with Abilify 5 mg daily, sertraline 50 mg daily and trazodone 50 mg at bedtime as needed for sleep.  Patient reports that she no longer requires trazodone for sleep and would like medication to be discontinued.  Patient reports medication compliance to sertraline and Abilify.  Patient denies adverse reactions to medications.  Patient reports continued depressive symptoms and is agreement with increasing sertraline to 75 mg daily. ? ?Patient is alert and oriented x4, calm, pleasant and willing to engage.  She reports a depressed mood and feeling fatigue during the day.  She reports good appetite and sleep.  Patient  denies suicidal or homicidal ideations, paranoia, delusional thought, auditory or visual hallucinations. ? ?Visit Diagnosis:  ?  ICD-10-CM   ?1. Bipolar 1 disorder, mixed, moderate (HCC)  F31.62   ?  ?2. Generalized anxiety disorder  F41.1   ?  ? ? ?Past Psychiatric History: Bipolar disorder and generalized anxiety disorder ? ?Past Medical History:  ?Past Medical History:  ?Diagnosis Date  ? Anemia   ? Anxiety   ? Bipolar disorder ([place])   ? Blood transfusion without reported diagnosis   ? Depression   ? GERD (gastroesophageal reflux disease)   ? Lab test positive for detection of COVID-19 virus on 11/02/2020 11/02/2020  ?  ?Past Surgical History:  ?Procedure Laterality Date  ? BREAST BIOPSY Right 2011  ? BREAST SURGERY    ? DILITATION & CURRETTAGE/HYSTROSCOPY WITH HYDROTHERMAL ABLATION N/A 01/20/2021  ? Procedure: DILATATION & CURETTAGE/HYSTEROSCOPY WITH HYDROTHERMAL ABLATION AND VAGINAL MYOMECTOMY WITH MYOSURE;  Surgeon: Osborne Oman, MD;  Location: [facility];  Service: Gynecology;  Laterality: N/A;  ? TUBAL LIGATION    ? ? ?Family Psychiatric History: n/a ? ?Family History:  ?Family History  ?Problem Relation Age of Onset  ? Diabetes Mother   ? Parkinson's disease Father   ? Lupus Sister   ? ? ?Social History:  ?Social History  ? ?Socioeconomic History  ? Marital status: Single  ?  Spouse name: Not on file  ? Number of children: 3  ? Years of education: Not on file  ? Highest education level: High school graduate  ?Occupational History  ? Not on file  ?Tobacco Use  ? Smoking status: Never  ?  Smokeless tobacco: Never  ?Vaping Use  ? Vaping Use: Never used  ?Substance and Sexual Activity  ? Alcohol use: Yes  ?  Comment: occasionally  ? Drug use: Never  ? Sexual activity: Yes  ?  Birth control/protection: Surgical  ?Other Topics Concern  ? Not on file  ?Social History Narrative  ? Not on file  ? ?Social Determinants of Health  ? ?Financial Resource Strain: Not on file  ?Food Insecurity: Food  Insecurity Present  ? Worried About Charity fundraiser in the Last Year: Sometimes true  ? Ran Out of Food in the Last Year: Sometimes true  ?Transportation Needs: No Transportation Needs  ? Lack of Transportation (Medical): No  ? Lack of Transportation (Non-Medical): No  ?Physical Activity: Not on file  ?Stress: Not on file  ?Social Connections: Not on file  ? ? ?Allergies: No Known Allergies ? ?Metabolic Disorder Labs: ?No results found for: HGBA1C, MPG ?No results found for: PROLACTIN ?No results found for: CHOL, TRIG, HDL, CHOLHDL, VLDL, LDLCALC ?Lab Results  ?Component Value Date  ? TSH 2.200 08/18/2020  ? ? ?Therapeutic Level Labs: ?No results found for: LITHIUM ?No results found for: VALPROATE ?No components found for:  CBMZ ? ?Current Medications: ?Current Outpatient Medications  ?Medication Sig Dispense Refill  ? ARIPiprazole (ABILIFY) 5 MG tablet Take 1 tablet (5 mg total) by mouth daily. 30 tablet 3  ? docusate sodium (COLACE) 100 MG capsule Take 1 capsule (100 mg total) by mouth 2 (two) times daily as needed for mild constipation or moderate constipation. 30 capsule 1  ? ferrous sulfate 325 (65 FE) MG tablet Take 325 mg by mouth daily with breakfast.    ? loratadine (CLARITIN) 10 MG tablet Take 10 mg by mouth daily.    ? naproxen (NAPROSYN) 500 MG tablet Take 1 tablet (500 mg total) by mouth 2 (two) times daily with a meal. As needed for pain 30 tablet 2  ? omeprazole (PRILOSEC) 40 MG capsule Take 40 mg by mouth daily.    ? polyethylene glycol (MIRALAX / GLYCOLAX) 17 g packet Take 17 g by mouth daily.    ? sertraline (ZOLOFT) 50 MG tablet Take 1 tablet (50 mg total) by mouth daily. 30 tablet 3  ? ?No current facility-administered medications for this visit.  ? ? ? ?Musculoskeletal: ?Strength & Muscle Tone:  n/a virtual  ?Gait & Station:  n/a ?Patient leans: N/A ? ?Psychiatric Specialty Exam: ?Review of Systems  ?Psychiatric/Behavioral:  Negative for hallucinations, self-injury and suicidal ideas.   ?All  other systems reviewed and are negative.  ?There were no vitals taken for this visit.There is no height or weight on file to calculate BMI.  ?General Appearance: well groomed  ?Eye Contact:  Good  ?Speech:  Normal Rate  ?Volume:  Normal  ?Mood:  Depressed  ?Affect:  Congruent  ?Thought Process:  Goal Directed  ?Orientation:  Full (Time, Place, and Person)  ?Thought Content: Logical   ?Suicidal Thoughts:  No  ?Homicidal Thoughts:  No  ?Memory:  Immediate;   Good ?Recent;   Good ?Remote;   Good  ?Judgement:  Good  ?Insight:  Good  ?Psychomotor Activity:  NA  ?Concentration:  Concentration: Good and Attention Span: Good  ?Recall:  Good  ?Fund of Knowledge: Good  ?Language: Good  ?Akathisia:  NA  ?Handed:  Right  ?AIMS (if indicated): not done  ?Assets:  Communication Skills ?Desire for Improvement  ?ADL's:  Intact  ?Cognition: WNL  ?Sleep:  Good  ? ?  Screenings: ?GAD-7   ? ?Flowsheet Row Clinical Support from 10/06/2021 in [facility] Office Visit from 06/09/2021 in [facility] Office Visit from 03/17/2021 in Center for [place] at [facility] for Women Clinical Support from 02/15/2021 in [place] for [place] at [facility] for [place] from 02/03/2021 in Center for [place] at [facility] for Women  ?Total GAD-7 Score '14 13 10 14 10  '$ ? ?  ? ?PHQ2-9   ? ?Flowsheet Row Clinical Support from 10/06/2021 in [facility] Office Visit from 06/09/2021 in [facility] Counselor from 04/07/2021 in [facility] Office Visit from 03/17/2021 in [place] for [place] at [facility] for Women Clinical Support from 02/15/2021 in Center for [place] at [facility] for Women  ?PHQ-2 Total Score '2 4 4 4 3  '$ ?PHQ-9 Total Score '11 18 20 18 19  '$ ? ?  ? ?Flowsheet Row Clinical Support from  10/06/2021 in [facility] Office Visit from 06/09/2021 in [facility] Counselor from 04/07/2021 in [facility]  ?C-SSRS RISK C

## 2022-01-14 ENCOUNTER — Other Ambulatory Visit: Payer: Self-pay

## 2022-01-19 ENCOUNTER — Ambulatory Visit (INDEPENDENT_AMBULATORY_CARE_PROVIDER_SITE_OTHER): Payer: No Payment, Other | Admitting: Licensed Clinical Social Worker

## 2022-01-19 DIAGNOSIS — F3161 Bipolar disorder, current episode mixed, mild: Secondary | ICD-10-CM | POA: Diagnosis not present

## 2022-01-19 NOTE — Progress Notes (Signed)
? ?THERAPIST PROGRESS NOTE ? ? ?Virtual Visit via Telephone Note ? ?I connected with Samantha Day on 01/19/22 at  2:00 PM EDT by telephone and verified that I am speaking with the correct person using two identifiers. ? ?Location: ?Patient: Home ?Provider: [facility] ?  ?I discussed the limitations, risks, security and privacy concerns of performing an evaluation and management service by telephone and the availability of in person appointments. I also discussed with the patient that there may be a patient responsible charge related to this service. The patient expressed understanding and agreed to proceed. ?I discussed the assessment and treatment plan with the patient. The patient was provided an opportunity to ask questions and all were answered. The patient agreed with the plan and demonstrated an understanding of the instructions. ?  ?The patient was advised to call back or seek an in-person evaluation if the symptoms worsen or if the condition fails to improve as anticipated. ? ?I provided 17 minutes of non-face-to-face time during this encounter. ? ?Participation Level: Active ? ?Behavioral Response:  UTAAlertAnxious and Depressed ? ?Type of Therapy: Individual Therapy ? ?Treatment Goals addressed: dep/anx/stressors/coping ? ?ProgressTowards Goals: Progressing ? ?Interventions: Supportive ? ?Summary: Samantha Day is a 46 y.o. female who presents with hx of mixed Bipolar Dis.  Today patient answers telephone for telephone session.  There is excessive background noise and patient states she is outside.  She agrees to go inside to eliminate background noise.  Today Samantha Day states she is doing fairly well.  She states she is finally getting connected with autism services for her son.  She reports she is connected with [facility] and states he is also going to be going to a daycare program at the [facility] [employer].  LCSW commended Samantha Day for finally following up with the services noting how much the  services/support will benefit both her son and herself.  Samantha Day states once her son is in the daycare program she intends to find work.  She states she has finally gotten her work permit and now is only waiting on her [place] card.  She continues to be connected with the women's resource center where she also finally followed up and is hopeful they will be able to help her with job placement. Samantha Day feels she is less frustrated and irritable with positive developments occurring.  Today LCSW advises Samantha Day of this clinician's resignation from [facility] behavioral health.  When asked, Samantha Day confirms she is getting counseling services at [place].  LCSW encouraged her to continue services with [facility] the [place] and use [facility] behavioral health as needed for future counseling services.  Patient verbalizes understanding. Both patient and clinician wish each other well going forward. ? ?Suicidal/Homicidal: Nowithout intent/plan ? ?Therapist Response: Pt receptive to care. ? ?Plan: Return again prn. Pt has counseling services at [place] ? ?Diagnosis: Bipolar 1 disorder, mixed, mild ([place]) ? ?Collaboration of Care: Other None deemed necessary this session. ? ?Patient/Guardian was advised Release of Information must be obtained prior to any record release in order to collaborate their care with an outside provider. Patient/Guardian was advised if they have not already done so to contact the registration department to sign all necessary forms in order for [place] to release information regarding their care.  ? ?Consent: Patient/Guardian gives verbal consent for treatment and assignment of benefits for services provided during this visit. Patient/Guardian expressed understanding and agreed to proceed.  ? ?Samantha Day  Ileene Patrick, LCSW ?01/19/2022 ? ?

## 2022-01-21 ENCOUNTER — Other Ambulatory Visit: Payer: Self-pay

## 2022-02-17 ENCOUNTER — Ambulatory Visit (HOSPITAL_COMMUNITY): Payer: No Payment, Other | Admitting: Licensed Clinical Social Worker

## 2022-03-01 ENCOUNTER — Other Ambulatory Visit: Payer: Self-pay

## 2022-03-08 ENCOUNTER — Other Ambulatory Visit: Payer: Self-pay

## 2022-03-11 ENCOUNTER — Other Ambulatory Visit: Payer: Self-pay

## 2022-04-06 ENCOUNTER — Telehealth (HOSPITAL_COMMUNITY): Payer: No Payment, Other | Admitting: Psychiatry

## 2022-07-15 ENCOUNTER — Other Ambulatory Visit: Payer: Self-pay

## 2022-07-15 DIAGNOSIS — Z1231 Encounter for screening mammogram for malignant neoplasm of breast: Secondary | ICD-10-CM

## 2022-07-26 ENCOUNTER — Ambulatory Visit
Admission: RE | Admit: 2022-07-26 | Discharge: 2022-07-26 | Disposition: A | Discharge status: Home / Self Care | Payer: No Typology Code available for payment source | Source: Ambulatory Visit | Attending: *Deleted | Admitting: *Deleted

## 2022-07-26 DIAGNOSIS — Z1231 Encounter for screening mammogram for malignant neoplasm of breast: Secondary | ICD-10-CM

## 2022-08-18 ENCOUNTER — Other Ambulatory Visit: Payer: Self-pay | Admitting: *Deleted

## 2022-08-18 DIAGNOSIS — Z124 Encounter for screening for malignant neoplasm of cervix: Secondary | ICD-10-CM

## 2022-08-18 NOTE — Progress Notes (Signed)
Patient: Samantha Day           Date of Birth: 09-23-1976           MRN: [id number] Visit Date: 08/18/2022 PCP: Marliss Coots, NP  Cervical Cancer Screening Do you smoke?: No Have you ever had or been told you have an allergy to latex products?: No Marital status: Single Date of last pap smear: 1-2 yrs ago (06/29/21) Date of last menstrual period: 08/12/22 Number of pregnancies: 4 Number of births: 3 Have you ever had any of the following? Hysterectomy: No Tubal ligation (tubes tied): Yes Abnormal bleeding: Yes Abnormal pap smear: Yes (06/29/21 (LSIL/AGUS/+HPV)) Venereal warts: Yes A sex partner with venereal warts: Yes A high risk* sex partner: No  Cervical Exam  Abnormal Observations: Blood observed at cervical os. Patient complained of AUB for years and has been followed by the [facility] for [place]. Recommended patient to schedule a follow up appointment at the [facility] for [facility] and told her about the [facility] application. Recommendations: Last Pap smear was 06/29/2021 at [facility] and was abnormal-LSIL with positive HPV that a colposcopy was completed for follow up in November 2022 at [facility] per patient. 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 Last Pap smear result is available in Epic. Let patient know if today's Pap smear is normal and HPV negative that her next Pap smear will be due in 5 years. Informed patient that will follow up with her within the next couple of weeks with results of her Pap smear by phone. Patient verbalized understanding.      Patient's History Patient Active  Problem List   Diagnosis Date Noted   Bipolar 1 disorder, mixed, moderate ([place]) 06/09/2021   Generalized anxiety disorder 06/09/2021   Fibroids 01/20/2021   Abnormal uterine bleeding (AUB) 01/20/2021   Bipolar disorder ([place])    Lab test positive for detection of COVID-19 virus on 11/02/2020 11/02/2020   Past Medical History:  Diagnosis Date   Anemia    Anxiety    Bipolar disorder ([place])    Blood transfusion without reported diagnosis    Depression    GERD (gastroesophageal reflux disease)    Lab test positive for detection of COVID-19 virus on 11/02/2020 11/02/2020    Family History  Problem Relation Age of Onset   Diabetes Mother    Parkinson's disease Father    Lupus Sister     Social History   Occupational History   Not on file  Tobacco Use   Smoking status: Never   Smokeless tobacco: Never  Vaping Use   Vaping Use: Never used  Substance and Sexual Activity   Alcohol use: Yes    Comment: occasionally   Drug use: Never   Sexual activity: Yes    Birth control/protection: Surgical

## 2022-08-23 ENCOUNTER — Telehealth: Payer: Self-pay

## 2022-08-23 LAB — CYTOLOGY - PAP
Comment: NEGATIVE
Diagnosis: UNDETERMINED — AB
High risk HPV: NEGATIVE

## 2022-08-23 NOTE — Telephone Encounter (Signed)
Called patient to give pap smear results. Informed patient that pap smear showed (ASCUS, - HPV). Based on this result and her previous hx her next pap smear will be due in 1 year. Patient voiced understanding.  Referred patient to [facility] for FU for abnormal bleeding associated with hx of fibroids. Patient is aware [facility] will not cover FU.

## 2022-11-01 NOTE — Progress Notes (Unsigned)
GYNECOLOGY VISIT  Patient name: Samantha Day MRN [id number]  Date of birth: [birth year] Chief Complaint:   Vaginal Bleeding  History:  Samantha Day is a 47 y.o. [phone] being seen today for bleeding and pelvic pain.  .Last [place] 05/2021.  Underwent HTA ablation in 2022. Increased pain and bleeding with daily activities and wondering about a hysterectomy. Monthly bleeding now lasts 6 days, and pain is from fibroids but not her bleeding.   Bilateral lower abdominal apin and more recently radiating to lower back. Pain worsens after several stesp/exercise. Stabbing pain. Tylenol or naproxen will help. Sexually active - same pain in the lower area (penetrative intercourse). Pain worsens with menses. With menses, bloated and pressure in suprapubic region. Occasional spotting with intercours b/n menses. Initially amenorrheic for about 4 months (with 1 bleed at the start). Menses 6d were 6d preablation. This is lighter bleeding than prior to the ablation.   Past Medical History:  Diagnosis Date   Anemia    Anxiety    Bipolar disorder ([place])    Blood transfusion without reported diagnosis    Depression    GERD (gastroesophageal reflux disease)    Lab test positive for detection of COVID-19 virus on 11/02/2020 11/02/2020    Past Surgical History:  Procedure Laterality Date   BREAST BIOPSY Right 2011   BREAST SURGERY     DILITATION & CURRETTAGE/HYSTROSCOPY WITH HYDROTHERMAL ABLATION N/A 01/20/2021   Procedure: DILATATION & CURETTAGE/HYSTEROSCOPY WITH HYDROTHERMAL ABLATION AND VAGINAL MYOMECTOMY WITH MYOSURE;  Surgeon: Osborne Oman, MD;  Location: [place];  Service: Gynecology;  Laterality: N/A;   TUBAL LIGATION      The following portions of the patient's history were reviewed and updated as appropriate: allergies, current medications, past family history, past medical history, past social history, past surgical history and problem list.   Health Maintenance:   Last  pap 07/2022. Results were: ASCUS w/ HRHPV negative. H/O abnormal pap: yes Last mammogram: 07/2022. Results were: normal.    Review of Systems:  Pertinent items are noted in HPI. Comprehensive review of systems was otherwise negative.   Objective:  Physical Exam BP 117/81   Pulse (!) 56    Physical Exam Vitals and nursing note reviewed. Exam conducted with a chaperone present.  Constitutional:      Appearance: Normal appearance.  HENT:     Head: Normocephalic and atraumatic.  Cardiovascular:     Rate and Rhythm: Normal rate and regular rhythm.  Pulmonary:     Effort: Pulmonary effort is normal.     Breath sounds: Normal breath sounds.  Abdominal:     Palpations: Abdomen is soft.     Comments: + carnett  No trigger points + FABER right > left   Genitourinary:    General: Normal vulva.     Exam position: Lithotomy position.     Vagina: Normal.     Comments: Normal appearing vulva Normal vulvar sensation bilaterally Nontender superficial pelvic floor muscles Nontender ischial tuberosities bilaterally  Allodynia at introitus: Yes: 12-9 oclcock   Anal wink present Posterior vaginal wall tender  1/10 Right levator ani 3/10 Right ischiococcygeous 3/10 Right obturator internus 3/10 Left levator ani 1/10 Left ischioccocygeous 2/10 Left obturator internus 2/10 Uterine tenderness tender Kegel 1/5   Skin:    General: Skin is warm and dry.  Neurological:     General: No focal deficit present.     Mental Status: She is alert.  Psychiatric:  Mood and Affect: Mood normal.        Behavior: Behavior normal.        Thought Content: Thought content normal.        Judgment: Judgment normal.    Labs and Imaging No results found.     Assessment & Plan:   1. Pelvic pain Myalgia throughout core and pelvis. Recommend starting with pelvic PT. Trial of muscle relaxer - noted that it can cause daytime drowsiness so recommend starting with taking before bed. May need to  take following PFPT session as well. Send urine to rule out urinary source of pain/discomfort.  - US PELVIC COMPLETE WITH TRANSVAGINAL; Future - cyclobenzaprine (FLEXERIL) 10 MG tablet; Take 1 tablet (10 mg total) by mouth 2 (two) times daily as needed for muscle spasms.  Dispense: 30 tablet; Refill: 2 - Urine Culture  2. History of endometrial ablation Reviewed that lighter bleeding is expected outcome following endometrial ablation. Recommend pelvic US to assess fibroids and stripe, caveat that stripe likely to appear abnormal. Based on hx, pain is more of an issue than bleeding.   Follow up pain 4-6 months to assess response to PFPT  Routine preventative health maintenance measures emphasized.  I provided more than 30 minutes of care during this encounter, >50% face-to-face time during this encounter.  Darliss Cheney, MD [facility] [place]

## 2022-11-02 ENCOUNTER — Ambulatory Visit (INDEPENDENT_AMBULATORY_CARE_PROVIDER_SITE_OTHER): Payer: Self-pay | Admitting: Obstetrics and Gynecology

## 2022-11-02 ENCOUNTER — Other Ambulatory Visit: Payer: Self-pay

## 2022-11-02 ENCOUNTER — Encounter: Payer: Self-pay | Admitting: Obstetrics and Gynecology

## 2022-11-02 VITALS — BP 117/81 | HR 56

## 2022-11-02 DIAGNOSIS — R102 Pelvic and perineal pain: Secondary | ICD-10-CM

## 2022-11-02 DIAGNOSIS — Z9889 Other specified postprocedural states: Secondary | ICD-10-CM

## 2022-11-02 MED ORDER — CYCLOBENZAPRINE HCL 10 MG PO TABS
10.0000 mg | ORAL_TABLET | Freq: Two times a day (BID) | ORAL | 2 refills | Status: DC | PRN
  Filled 2022-11-02 – 2022-12-20 (×2): qty 30, 15d supply, fill #0

## 2022-11-02 NOTE — Patient Instructions (Signed)
Your back, belly and pelvic floor are all painful - I have placed a referral to pelvic physical therapy.   We will follow up after your physical therapy and your ultrasound is done in 3-4 months.   Also starting a muscle relaser

## 2022-11-04 LAB — URINE CULTURE

## 2022-11-09 ENCOUNTER — Other Ambulatory Visit: Payer: Self-pay

## 2022-11-21 ENCOUNTER — Ambulatory Visit
Admission: RE | Admit: 2022-11-21 | Discharge: 2022-11-21 | Disposition: A | Discharge status: Home / Self Care | Payer: No Typology Code available for payment source | Source: Ambulatory Visit | Attending: Obstetrics and Gynecology | Admitting: Obstetrics and Gynecology

## 2022-11-21 DIAGNOSIS — R102 Pelvic and perineal pain: Secondary | ICD-10-CM | POA: Insufficient documentation

## 2022-11-22 ENCOUNTER — Telehealth: Payer: Self-pay

## 2022-11-22 ENCOUNTER — Other Ambulatory Visit: Payer: Self-pay | Admitting: Obstetrics and Gynecology

## 2022-11-22 DIAGNOSIS — R102 Pelvic and perineal pain: Secondary | ICD-10-CM

## 2022-11-22 NOTE — Telephone Encounter (Signed)
VM left on nurse line by patient returning a call from [place]. No one by that name in the office. Will call patient to follow up.

## 2022-11-26 IMAGING — MG MM DIGITAL SCREENING BILAT W/ TOMO AND CAD
8 series · 9 of 24 positions shown · non-contrast
Comparison: Previous exam(s).

CLINICAL DATA: Screening.

EXAM:
DIGITAL SCREENING BILATERAL MAMMOGRAM WITH TOMOSYNTHESIS AND CAD
TECHNIQUE: Bilateral screening digital craniocaudal and mediolateral oblique
mammograms were obtained. Bilateral screening digital breast
tomosynthesis was performed. The images were evaluated with
computer-aided detection.

[R MLO synth-2D]
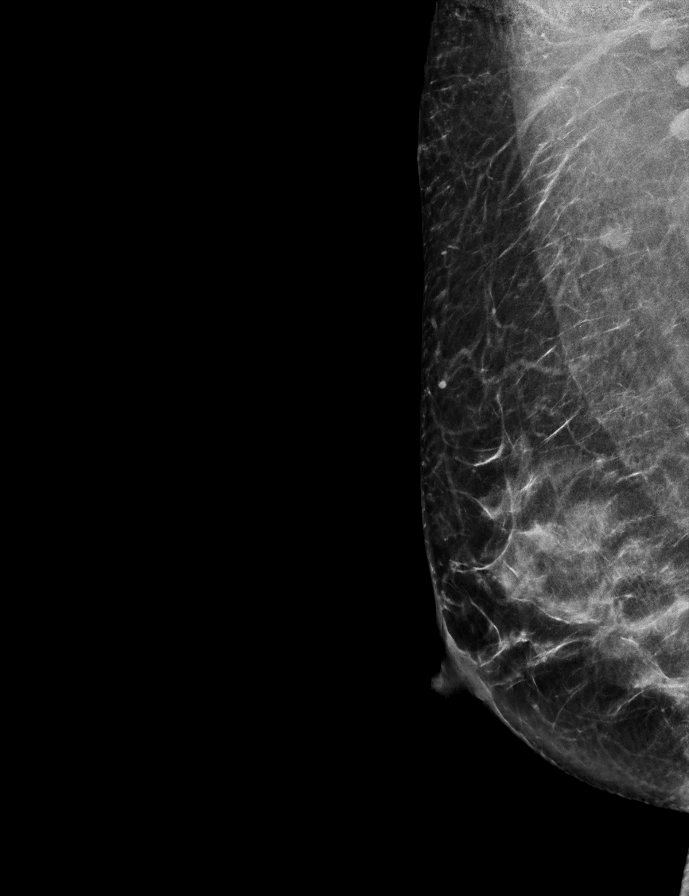

[R CC synth-2D]
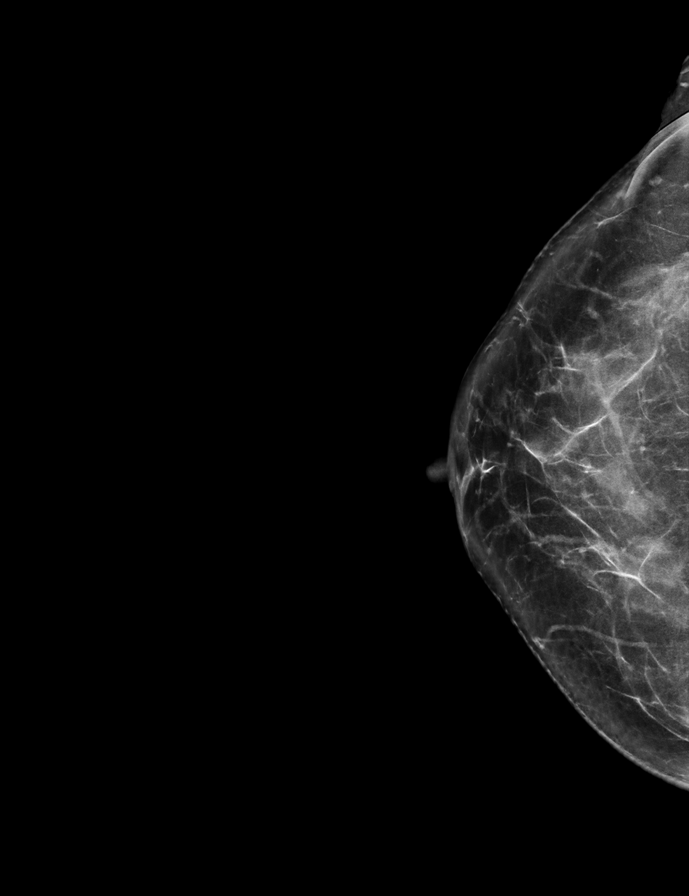

[L CC synth-2D]
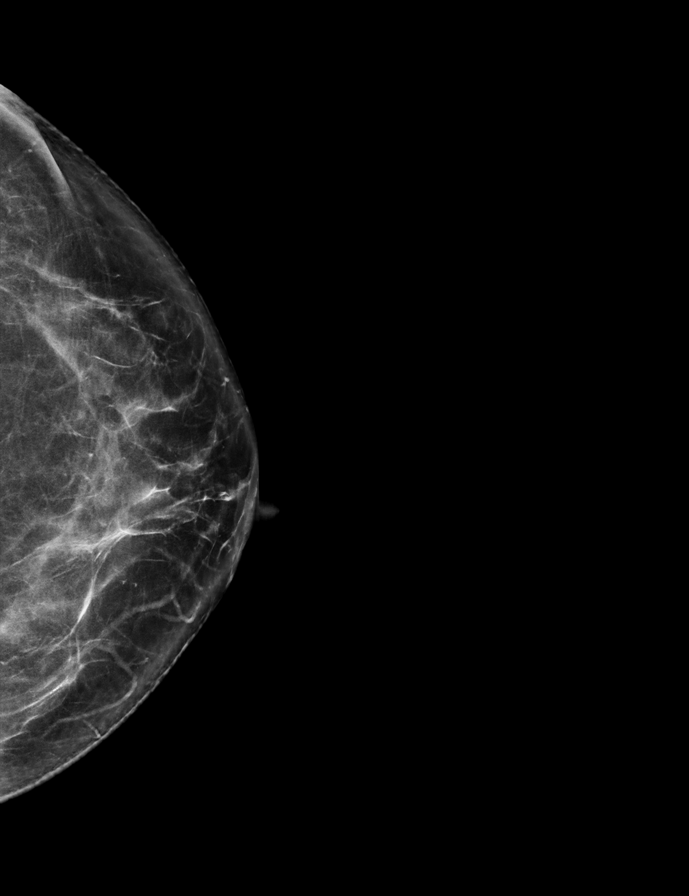

[L MLO synth-2D]
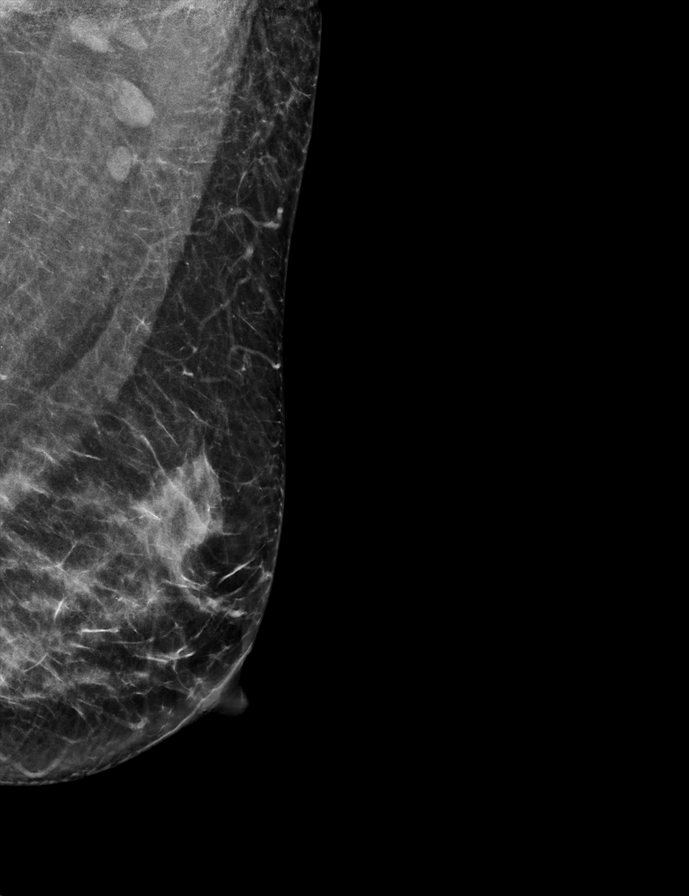

[L CC tomo · 2 of 66 frames shown]
[frame 22/66]
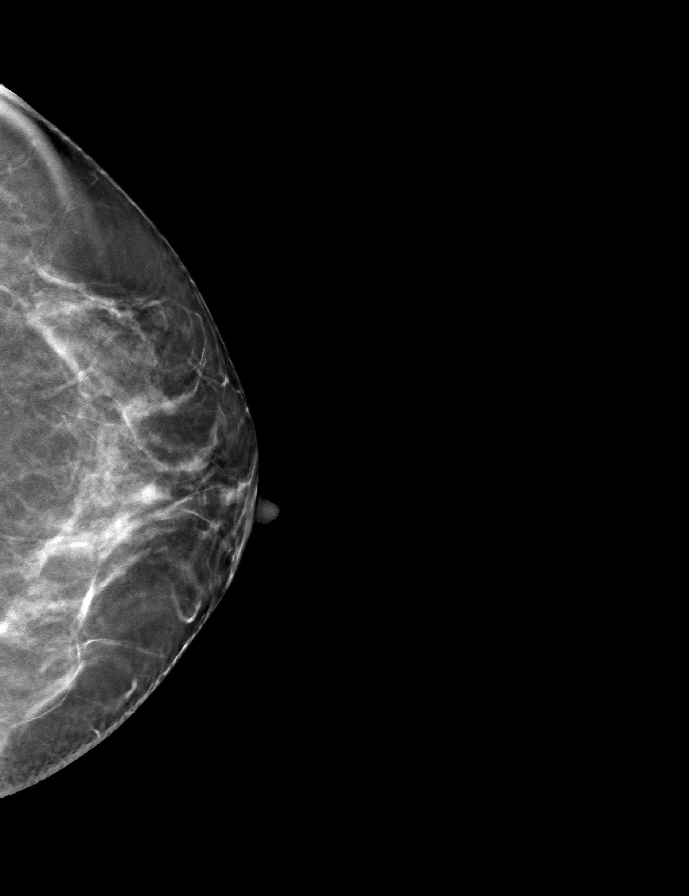
[frame 33/66]
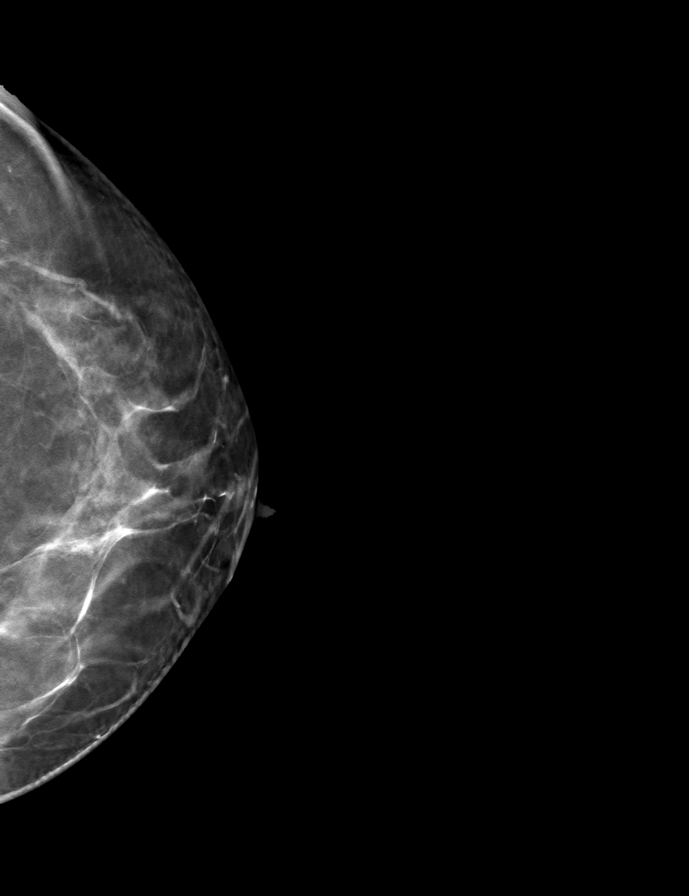

[L MLO tomo · tomo slice 33/65.0]
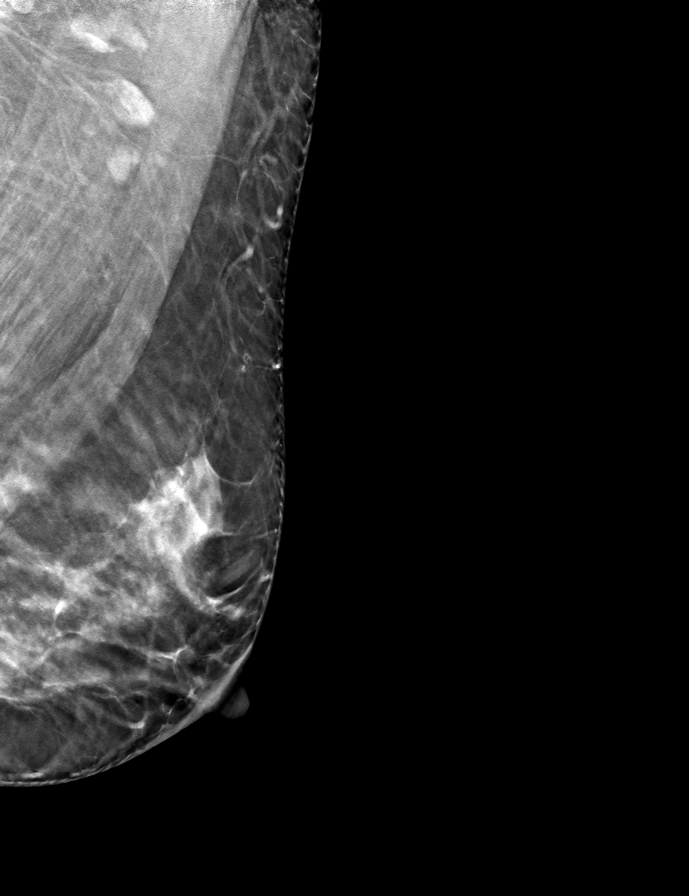

[R CC tomo · tomo slice 33/65.0]
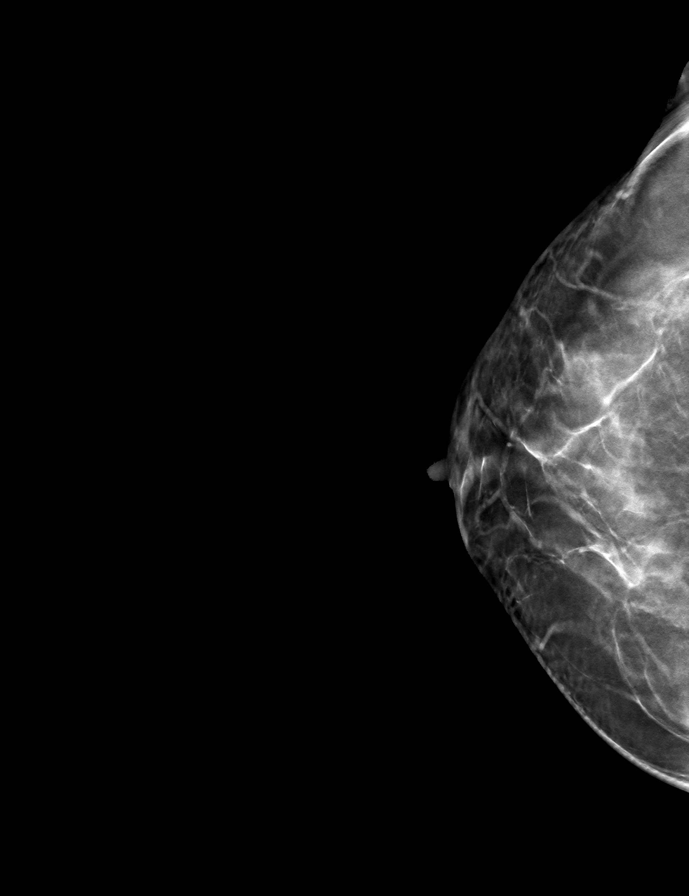

[R MLO tomo · tomo slice 35/68.0]
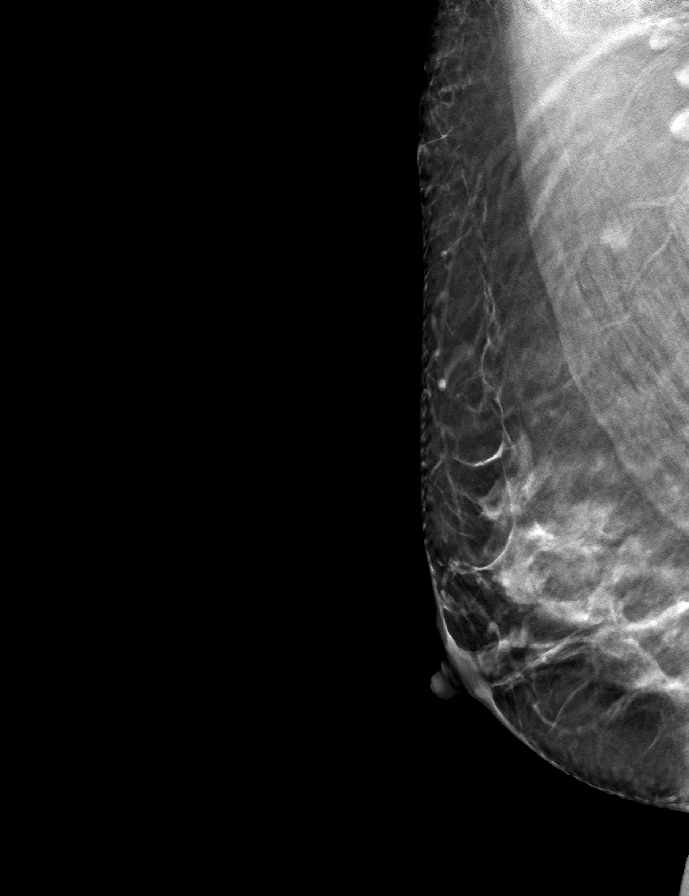

[9 of 24 positions shown; findings below may reference images not displayed]

ACR Breast Density Category c: The breast tissue is heterogeneously
dense, which may obscure small masses.
FINDINGS: There are no findings suspicious for malignancy.
IMPRESSION: No mammographic evidence of malignancy. A result letter of this
screening mammogram will be mailed directly to the patient.

RECOMMENDATION:
Screening mammogram in one year. (Code:Q3-W-BC3)

BI-RADS CATEGORY  1: Negative.

## 2022-11-28 NOTE — Telephone Encounter (Signed)
Called pt who states she has spoken with interpreter Danae Chen before. Was calling for [place] results. Results reviewed with Ajewole MD who states results show fibroids and endometrium appropriate s/p ablation. Reviewed results with interpreter Claudia. Reviewed all upcoming appts.

## 2022-12-20 ENCOUNTER — Other Ambulatory Visit: Payer: Self-pay

## 2022-12-20 ENCOUNTER — Encounter: Payer: Self-pay | Admitting: Obstetrics and Gynecology

## 2022-12-20 ENCOUNTER — Ambulatory Visit (INDEPENDENT_AMBULATORY_CARE_PROVIDER_SITE_OTHER): Payer: Self-pay | Admitting: Obstetrics and Gynecology

## 2022-12-20 VITALS — BP 126/62 | HR 69

## 2022-12-20 DIAGNOSIS — R5383 Other fatigue: Secondary | ICD-10-CM

## 2022-12-20 DIAGNOSIS — R102 Pelvic and perineal pain unspecified side: Secondary | ICD-10-CM

## 2022-12-20 DIAGNOSIS — Z9889 Other specified postprocedural states: Secondary | ICD-10-CM

## 2022-12-20 LAB — CBC
Hematocrit: 40.8 % (ref 34.0–46.6)
Hemoglobin: 13.2 g/dL (ref 11.1–15.9)
MCH: 29.1 pg (ref 26.6–33.0)
MCHC: 32.4 g/dL (ref 31.5–35.7)
MCV: 90 fL (ref 79–97)
Platelets: 228 10*3/uL (ref 150–450)
RBC: 4.53 x10E6/uL (ref 3.77–5.28)
RDW: 13.2 % (ref 11.7–15.4)
WBC: 6.1 10*3/uL (ref 3.4–10.8)

## 2022-12-20 NOTE — Progress Notes (Signed)
GYNECOLOGY VISIT  Patient name: Samantha Day MRN [id number]  Date of birth: 1976-04-20 Chief Complaint:   Follow-up   History:  Samantha Day is a 47 y.o. [phone] being seen today for bleeding and pain follow up.  So did not pick up flexeril and hoping to have it done at [facility].  Intetrsted in hysterectomy as well. PFPT not yet started. Wondering if the hysterectomy will make her feel bettter and fix all of the issues.  Bleeding stopped but not as heavy, more likely spotting and lasting 4-5 days.    Past Medical History:  Diagnosis Date   Anemia    Anxiety    Bipolar disorder ([place])    Blood transfusion without reported diagnosis    Depression    GERD (gastroesophageal reflux disease)    Lab test positive for detection of COVID-19 virus on 11/02/2020 11/02/2020    Past Surgical History:  Procedure Laterality Date   BREAST BIOPSY Right 2011   BREAST SURGERY     DILITATION & CURRETTAGE/HYSTROSCOPY WITH HYDROTHERMAL ABLATION N/A 01/20/2021   Procedure: DILATATION & CURETTAGE/HYSTEROSCOPY WITH HYDROTHERMAL ABLATION AND VAGINAL MYOMECTOMY WITH MYOSURE;  Surgeon: Osborne Oman, MD;  Location: [place];  Service: Gynecology;  Laterality: N/A;   TUBAL LIGATION      The following portions of the patient's history were reviewed and updated as appropriate: allergies, current medications, past family history, past medical history, past social history, past surgical history and problem list.   Health Maintenance:   Last pap     Component Value Date/Time   DIAGPAP (A) 08/18/2022 1506    - Atypical squamous cells of undetermined significance (ASC-US)   DIAGPAP - Low grade squamous intraepithelial lesion (LSIL) (A) 06/29/2021 1542   DIAGPAP - Atypical glandular cells, NOS (A) 06/29/2021 1542   [place] Negative 08/18/2022 1506   HPVHIGH Positive (A) 06/29/2021 1542   [place] Negative 06/23/2020 1514   ADEQPAP  08/18/2022 1506    Satisfactory for evaluation;  transformation zone component PRESENT.   ADEQPAP  06/29/2021 1542    Satisfactory for evaluation; transformation zone component PRESENT.   ADEQPAP  06/23/2020 1514    Satisfactory for evaluation; transformation zone component PRESENT.  PAP DUE 07/2023  Last mammogram: 07/2022 [facility]   Review of Systems:  Pertinent items are noted in HPI. Comprehensive review of systems was otherwise negative.   Objective:  Physical Exam BP 126/62   Pulse 69    Physical Exam Vitals and nursing note reviewed.  Constitutional:      Appearance: Normal appearance.  HENT:     Head: Normocephalic and atraumatic.  Pulmonary:     Effort: Pulmonary effort is normal.  Skin:    General: Skin is warm and dry.  Neurological:     General: No focal deficit present.     Mental Status: She is alert.  Psychiatric:        Mood and Affect: Mood normal.        Behavior: Behavior normal.        Thought Content: Thought content normal.        Judgment: Judgment normal.      Labs and Imaging US PELVIC COMPLETE WITH TRANSVAGINAL  Result Date: 11/21/2022 CLINICAL DATA:  Multiple fibroids.  Pelvic pain. EXAM: TRANSABDOMINAL AND TRANSVAGINAL ULTRASOUND OF PELVIS TECHNIQUE: Both transabdominal and transvaginal ultrasound examinations of the pelvis were performed. Transabdominal technique was performed for global imaging of the pelvis including uterus, ovaries, adnexal regions, and  pelvic cul-de-sac. It was necessary to proceed with endovaginal exam following the transabdominal exam to visualize the endometrium and adnexal structures. COMPARISON:  None Available. FINDINGS: Uterus Measurements: 9.5 x 6.3 x 8.6 cm = volume: 265.2 mL. There is a 5.1 x 3.4 x 4.4 cm fibroid within the posterior uterine body. There is a 4.4 x 3.4 x 3.7 cm fibroid within the uterine fundus. There is a 3.3 x 2.9 x 2.7 cm intramural fibroid within the uterine fundus. Endometrium The endometrium is indistinct and difficult to visualize. This  may measure up to approximately 7 mm. There appears to be some mass-effect from the adjacent fibroids. Right ovary Measurements: 2.4 x 1.0 x 1.1 cm = volume: 1.4 mL. Normal appearance/no adnexal mass. Left ovary Measurements: 3.7 x 2.6 x 2.8 cm = volume: 14.6 mL. Normal appearance/no adnexal mass. Other findings Trace fluid IMPRESSION: 1. Enlarged fibroid uterus. 2. The endometrium is indistinct and difficult to visualize, potentially secondary to history of endometrial ablation as well as mass effect from adjacent fibroids. Consider further evaluation of the uterus with pelvic MRI, particularly with attention to the endometrium, as clinically warranted. Electronically Signed   By: Lovey Newcomer M.D.   On: 11/21/2022 09:04       Assessment & Plan:   1. Pelvic pain Has upcoming PFPT visit - will trial and pick up muscle relaxer from original pharmacy. Reviewed that there may be benefit of hysterectomy in that there may be less pelvic pressure experienced but that there is possibility of pain flare immediately following surgery.   2. History of endometrial ablation Continues to have light bleeding, also has multiple fibroids with slight interval increase in size. Interested in hysterectomy. Discussed hysterectomy, minimally invasive approach and serves as definitive management of bleeding but may not resolve all of her pain.   3. Tiredness Will get CBC today as may have anemia. Noted that improvement in tiredness after hysterectomy would only be in the case of having severe anemia that is improved. If CBC wnl, recommend follow up with PCP for further investigation as to why she feels so tired.  - CBC  Does not currently have insurance - recommend application to financial assistance, especially in consideration of hysterectomy.   Routine preventative health maintenance measures emphasized.  Darliss Cheney, MD [facility] [place]

## 2022-12-21 ENCOUNTER — Telehealth: Payer: Self-pay

## 2022-12-21 NOTE — Telephone Encounter (Signed)
Called Pt using [place] id# [id number] to go over Normal CBC results & that she will need to contact PCP for Tiredness, Pt verbalized understanding.

## 2022-12-21 NOTE — Telephone Encounter (Signed)
-----   Message from Darliss Cheney, MD sent at 12/21/2022 11:18 AM EST ----- Notify that CBC is normal and that she should follow up with PCP regarding tiredness.

## 2022-12-23 ENCOUNTER — Other Ambulatory Visit: Payer: Self-pay

## 2022-12-30 ENCOUNTER — Ambulatory Visit: Payer: Self-pay | Admitting: Physical Therapy

## 2023-01-30 ENCOUNTER — Ambulatory Visit: Payer: Self-pay | Attending: Obstetrics and Gynecology | Admitting: Physical Therapy

## 2023-01-30 ENCOUNTER — Encounter: Payer: Self-pay | Admitting: Physical Therapy

## 2023-01-30 ENCOUNTER — Other Ambulatory Visit: Payer: Self-pay

## 2023-01-30 DIAGNOSIS — M62838 Other muscle spasm: Secondary | ICD-10-CM | POA: Insufficient documentation

## 2023-01-30 DIAGNOSIS — R293 Abnormal posture: Secondary | ICD-10-CM | POA: Insufficient documentation

## 2023-01-30 DIAGNOSIS — R102 Pelvic and perineal pain: Secondary | ICD-10-CM | POA: Insufficient documentation

## 2023-01-30 DIAGNOSIS — M6281 Muscle weakness (generalized): Secondary | ICD-10-CM | POA: Insufficient documentation

## 2023-01-30 DIAGNOSIS — R279 Unspecified lack of coordination: Secondary | ICD-10-CM | POA: Insufficient documentation
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
## 2023-02-03 ENCOUNTER — Ambulatory Visit: Payer: Self-pay | Admitting: Physical Therapy

## 2023-02-03 ENCOUNTER — Encounter: Payer: Self-pay | Admitting: Physical Therapy

## 2023-02-03 DIAGNOSIS — R293 Abnormal posture: Secondary | ICD-10-CM

## 2023-02-03 DIAGNOSIS — M6281 Muscle weakness (generalized): Secondary | ICD-10-CM

## 2023-02-03 DIAGNOSIS — M62838 Other muscle spasm: Secondary | ICD-10-CM

## 2023-02-03 DIAGNOSIS — R279 Unspecified lack of coordination: Secondary | ICD-10-CM
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
## 2023-02-06 ENCOUNTER — Encounter: Payer: Self-pay | Admitting: Physical Therapy

## 2023-02-10 ENCOUNTER — Ambulatory Visit: Payer: Self-pay | Admitting: Physical Therapy

## 2023-02-10 DIAGNOSIS — M62838 Other muscle spasm: Secondary | ICD-10-CM

## 2023-02-10 DIAGNOSIS — M6281 Muscle weakness (generalized): Secondary | ICD-10-CM

## 2023-02-10 DIAGNOSIS — R293 Abnormal posture: Secondary | ICD-10-CM

## 2023-02-10 DIAGNOSIS — R279 Unspecified lack of coordination: Secondary | ICD-10-CM
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
## 2023-02-13 ENCOUNTER — Ambulatory Visit: Payer: Self-pay | Admitting: Physical Therapy

## 2023-02-13 DIAGNOSIS — M6281 Muscle weakness (generalized): Secondary | ICD-10-CM

## 2023-02-13 DIAGNOSIS — R279 Unspecified lack of coordination: Secondary | ICD-10-CM

## 2023-02-13 DIAGNOSIS — M62838 Other muscle spasm: Secondary | ICD-10-CM

## 2023-02-13 DIAGNOSIS — R293 Abnormal posture: Secondary | ICD-10-CM
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
## 2023-02-20 ENCOUNTER — Ambulatory Visit: Payer: Self-pay | Admitting: Physical Therapy

## 2023-02-20 DIAGNOSIS — M62838 Other muscle spasm: Secondary | ICD-10-CM

## 2023-02-20 DIAGNOSIS — R279 Unspecified lack of coordination: Secondary | ICD-10-CM

## 2023-02-20 DIAGNOSIS — R293 Abnormal posture: Secondary | ICD-10-CM

## 2023-02-20 DIAGNOSIS — M6281 Muscle weakness (generalized): Secondary | ICD-10-CM
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
## 2023-02-27 ENCOUNTER — Ambulatory Visit: Payer: Self-pay | Attending: Obstetrics and Gynecology | Admitting: Physical Therapy

## 2023-02-27 DIAGNOSIS — M6281 Muscle weakness (generalized): Secondary | ICD-10-CM | POA: Insufficient documentation

## 2023-02-27 DIAGNOSIS — R279 Unspecified lack of coordination: Secondary | ICD-10-CM | POA: Insufficient documentation

## 2023-02-27 DIAGNOSIS — M62838 Other muscle spasm: Secondary | ICD-10-CM | POA: Insufficient documentation

## 2023-02-27 DIAGNOSIS — R293 Abnormal posture: Secondary | ICD-10-CM | POA: Insufficient documentation
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
## 2023-03-06 ENCOUNTER — Ambulatory Visit: Payer: Self-pay | Admitting: Physical Therapy

## 2023-03-06 DIAGNOSIS — M6281 Muscle weakness (generalized): Secondary | ICD-10-CM

## 2023-03-06 DIAGNOSIS — M62838 Other muscle spasm: Secondary | ICD-10-CM

## 2023-03-06 DIAGNOSIS — R293 Abnormal posture: Secondary | ICD-10-CM
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
## 2023-03-13 ENCOUNTER — Ambulatory Visit: Payer: Self-pay | Admitting: Physical Therapy

## 2023-03-13 DIAGNOSIS — M6281 Muscle weakness (generalized): Secondary | ICD-10-CM

## 2023-03-13 DIAGNOSIS — R293 Abnormal posture: Secondary | ICD-10-CM

## 2023-03-13 DIAGNOSIS — M62838 Other muscle spasm: Secondary | ICD-10-CM
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
## 2023-03-25 DIAGNOSIS — Z90710 Acquired absence of both cervix and uterus: Secondary | ICD-10-CM

## 2023-03-25 HISTORY — DX: Acquired absence of both cervix and uterus: Z90.710

## 2023-04-24 ENCOUNTER — Ambulatory Visit: Payer: Self-pay | Attending: Obstetrics and Gynecology | Admitting: Physical Therapy

## 2023-04-24 DIAGNOSIS — R279 Unspecified lack of coordination: Secondary | ICD-10-CM | POA: Insufficient documentation

## 2023-04-24 DIAGNOSIS — R293 Abnormal posture: Secondary | ICD-10-CM | POA: Insufficient documentation

## 2023-04-24 DIAGNOSIS — M6281 Muscle weakness (generalized): Secondary | ICD-10-CM | POA: Insufficient documentation

## 2023-04-24 DIAGNOSIS — M62838 Other muscle spasm: Secondary | ICD-10-CM | POA: Insufficient documentation

## 2023-05-01 ENCOUNTER — Ambulatory Visit: Payer: Self-pay | Admitting: Physical Therapy

## 2023-05-01 ENCOUNTER — Telehealth: Payer: Self-pay | Admitting: Physical Therapy

## 2023-05-01 NOTE — Telephone Encounter (Signed)
PT called pt about this morning's appointment at 0930. Pt did not answer, voicemail not available. This is patient's second no show for appointment and her last scheduled appointment. Due to clinic's attendance policy pt will be discharged from PT.   Otelia Sergeant, PT, DPT 04/30/2409:08 AM

## 2023-06-30 ENCOUNTER — Other Ambulatory Visit: Payer: Self-pay | Admitting: Obstetrics and Gynecology

## 2023-06-30 DIAGNOSIS — Z1231 Encounter for screening mammogram for malignant neoplasm of breast: Secondary | ICD-10-CM

## 2023-08-17 ENCOUNTER — Ambulatory Visit (HOSPITAL_BASED_OUTPATIENT_CLINIC_OR_DEPARTMENT_OTHER): Payer: Self-pay

## 2023-08-17 ENCOUNTER — Ambulatory Visit: Payer: Self-pay

## 2023-08-24 ENCOUNTER — Ambulatory Visit: Payer: Self-pay | Admitting: Hematology and Oncology

## 2023-08-24 ENCOUNTER — Ambulatory Visit
Admission: RE | Admit: 2023-08-24 | Discharge: 2023-08-24 | Disposition: A | Discharge status: Home / Self Care | Payer: No Typology Code available for payment source | Source: Ambulatory Visit | Attending: Obstetrics and Gynecology | Admitting: Obstetrics and Gynecology

## 2023-08-24 ENCOUNTER — Ambulatory Visit: Payer: Self-pay

## 2023-08-24 VITALS — Wt 139.2 lb

## 2023-08-24 DIAGNOSIS — Z1231 Encounter for screening mammogram for malignant neoplasm of breast: Secondary | ICD-10-CM

## 2023-08-24 NOTE — Progress Notes (Signed)
[name] is a 47 y.o. female who presents to [facility] clinic today with no complaints.    Pap Smear: Pap not smear completed today  Physical exam: Breasts Breasts symmetrical. No skin abnormalities bilateral breasts. No nipple retraction bilateral breasts. No nipple discharge bilateral breasts. No lymphadenopathy. No lumps palpated bilateral breasts.     MS DIGITAL SCREENING TOMO BILATERAL  Result Date: 07/27/2022 CLINICAL DATA:  Screening. EXAM: DIGITAL SCREENING BILATERAL MAMMOGRAM WITH TOMOSYNTHESIS AND CAD TECHNIQUE: Bilateral screening digital craniocaudal and mediolateral oblique mammograms were obtained. Bilateral screening digital breast tomosynthesis was performed. The images were evaluated with computer-aided detection. COMPARISON:  Previous exam(s). ACR Breast Density Category c: The breast tissue is heterogeneously dense, which may obscure small masses. FINDINGS: There are no findings suspicious for malignancy. IMPRESSION: No mammographic evidence of malignancy. A result letter of this screening mammogram will be mailed directly to the patient. RECOMMENDATION: Screening mammogram in one year. (Code:SM-B-01Y) BI-RADS CATEGORY  1: Negative. Electronically Signed   By: Elberta Fortis M.D.   On: 07/27/2022 15:15   MM 3D SCREEN BREAST BILATERAL  Result Date: 06/30/2021 CLINICAL DATA:  Screening. EXAM: DIGITAL SCREENING BILATERAL MAMMOGRAM WITH TOMOSYNTHESIS AND CAD TECHNIQUE: Bilateral screening digital craniocaudal and mediolateral oblique mammograms were obtained. Bilateral screening digital breast tomosynthesis was performed. The images were evaluated with computer-aided detection. COMPARISON:  Previous exam(s). ACR Breast Density Category c: The breast tissue is heterogeneously dense, which may obscure small masses. FINDINGS: There are no findings suspicious for malignancy. IMPRESSION: No mammographic evidence of malignancy. A result letter of this screening mammogram will be mailed  directly to the patient. RECOMMENDATION: Screening mammogram in one year. (Code:SM-B-01Y) BI-RADS CATEGORY  1: Negative. Electronically Signed   By: Amie Portland M.D.   On: 06/30/2021 14:39   MS DIGITAL SCREENING TOMO BILATERAL  Result Date: 06/25/2020 CLINICAL DATA:  Screening. EXAM: DIGITAL SCREENING BILATERAL MAMMOGRAM WITH TOMO AND CAD COMPARISON:  Previous exam(s). ACR Breast Density Category c: The breast tissue is heterogeneously dense, which may obscure small masses. FINDINGS: There are no findings suspicious for malignancy. Images were processed with CAD. IMPRESSION: No mammographic evidence of malignancy. A result letter of this screening mammogram will be mailed directly to the patient. RECOMMENDATION: Screening mammogram in one year. (Code:SM-B-01Y) BI-RADS CATEGORY  1: Negative. Electronically Signed   By: Frederico Hamman M.D.   On: 06/25/2020 13:05      Pelvic/Bimanual Pap is not indicated today    Smoking History: Patient has never smoked and was not referred to quit line.    Patient Navigation: Patient education provided. Access to services provided for patient through [facility] program. No interpreter provided. No transportation provided   Colorectal Cancer Screening: Per patient has had colonoscopy completed on 2 weeks ago.  No complaints today. Benign   Breast and Cervical Cancer Risk Assessment: Patient does not have family history of breast cancer, known genetic mutations, or radiation treatment to the chest before age 67. Patient has history of cervical dysplasia, immunocompromised, or DES exposure in-utero.  Risk Scores as of Encounter on 08/24/2023     Samantha Day           5-year 0.77%   Lifetime 7.16%   This patient is [place] but has no documented birth country, so the [place] model used data from [place] patients to calculate their risk score. Document a birth country in the Demographics activity for a more accurate score.         Last calculated by Lucilla Lame  E, CMA on 08/24/2023 at  2:34 PM        A: B[facility]CP exam without pap smear No complaints with benign exam.   P: Referred patient to the [facility] for a screening mammogram. Appointment scheduled 08/24/2023.  Ilda Basset A, NP 08/24/2023 2:50 PM

## 2023-08-24 NOTE — Patient Instructions (Signed)
Taught Samantha Day about self breast awareness and gave educational materials to take home. Patient did not need a Pap smear today due to benign hysterectomy. Referred patient to the [facility] for screening mammogram. Appointment scheduled for 08/24/2023. Patient aware of appointment and will be there. Let patient know will follow up with her within the next couple weeks with results. Samantha Day verbalized understanding.  Pascal Lux, NP 2:55 PM

## 2024-07-25 ENCOUNTER — Other Ambulatory Visit: Payer: Self-pay | Admitting: Obstetrics and Gynecology

## 2024-07-25 DIAGNOSIS — Z1231 Encounter for screening mammogram for malignant neoplasm of breast: Secondary | ICD-10-CM

## 2024-09-09 NOTE — Progress Notes (Unsigned)
 [name] is a 48 y.o. [phone] female who presents to [facility] clinic today with {Blank single:19197::no complaints,complaint of} ***.    Pap Smear: Pap smear completed today. Last Pap smear was 08/18/2022 at the free cervical cancer screening clinic and was abnormal - ASCUS with negative HPV. Per patient has history of an abnormal Pap smear 06/29/2021 that was LSIL with positive HPV that a colposcopy was completed for follow up in November 2022 at [facility] per patient. 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 Last Pap smear result is available in Epic.   Physical exam: Breasts Breasts symmetrical. No skin abnormalities left breast. Observed a scar on the right outer breast that per patient had a lumpectomy completed in 2011 due to recurrent mastitis. No nipple retraction bilateral breasts. No nipple discharge bilateral breasts. No lymphadenopathy. No lumps palpated bilateral breasts. Complaints of right outer breast tenderness on exam.       MS 3D SCR MAMMO BILAT BR (aka MM) Result Date: 08/29/2023 CLINICAL DATA:  Screening. EXAM: DIGITAL SCREENING BILATERAL MAMMOGRAM WITH TOMOSYNTHESIS AND CAD TECHNIQUE: Bilateral screening digital craniocaudal and mediolateral oblique mammograms were obtained. Bilateral screening digital breast tomosynthesis was performed. The images were evaluated with computer-aided detection. COMPARISON:  Previous exam(s). ACR Breast Density Category c: The breasts are heterogeneously dense, which may obscure small masses. FINDINGS: There are no findings suspicious for malignancy. IMPRESSION: No mammographic evidence of malignancy. A result letter of  this screening mammogram will be mailed directly to the patient. RECOMMENDATION: Screening mammogram in one year. (Code:SM-B-01Y) BI-RADS CATEGORY  1: Negative. Electronically Signed   By: Toribio Agreste M.D.   On: 08/29/2023 10:32   MS DIGITAL SCREENING TOMO BILATERAL Result Date: 07/27/2022 CLINICAL DATA:  Screening. EXAM: DIGITAL SCREENING BILATERAL MAMMOGRAM WITH TOMOSYNTHESIS AND CAD TECHNIQUE: Bilateral screening digital craniocaudal and mediolateral oblique mammograms were obtained. Bilateral screening digital breast tomosynthesis was performed. The images were evaluated with computer-aided detection. COMPARISON:  Previous exam(s). ACR Breast Density Category c: The breast tissue is heterogeneously dense, which may obscure small masses. FINDINGS: There are no findings suspicious for malignancy. IMPRESSION: No mammographic evidence of malignancy. A result letter of this screening mammogram will be mailed directly to the patient. RECOMMENDATION: Screening mammogram in one year. (Code:SM-B-01Y) BI-RADS CATEGORY  1: Negative. Electronically Signed   By: Toribio Agreste M.D.   On: 07/27/2022 15:15   MM 3D SCREEN BREAST BILATERAL Result Date: 06/30/2021 CLINICAL DATA:  Screening. EXAM: DIGITAL SCREENING BILATERAL MAMMOGRAM WITH TOMOSYNTHESIS AND CAD TECHNIQUE: Bilateral screening digital craniocaudal and mediolateral oblique mammograms were obtained. Bilateral screening digital breast tomosynthesis was performed. The images were evaluated with computer-aided detection. COMPARISON:  Previous exam(s). ACR Breast Density Category c: The breast tissue is heterogeneously dense, which may obscure small masses. FINDINGS: There are no findings suspicious for malignancy. IMPRESSION: No mammographic evidence of malignancy. A result letter of this screening mammogram will be mailed directly to the patient. RECOMMENDATION: Screening mammogram in one year. (Code:SM-B-01Y) BI-RADS CATEGORY  1: Negative. Electronically Signed    By: Alm Parkins M.D.   On: 06/30/2021 14:39   MS DIGITAL SCREENING TOMO BILATERAL Result Date: 06/25/2020 CLINICAL DATA:  Screening. EXAM:  DIGITAL SCREENING BILATERAL MAMMOGRAM WITH TOMO AND CAD COMPARISON:  Previous exam(s). ACR Breast Density Category c: The breast tissue is heterogeneously dense, which may obscure small masses. FINDINGS: There are no findings suspicious for malignancy. Images were processed with CAD. IMPRESSION: No mammographic evidence of malignancy. A result letter of this screening mammogram will be mailed directly to the patient. RECOMMENDATION: Screening mammogram in one year. (Code:SM-B-01Y) BI-RADS CATEGORY  1: Negative. Electronically Signed   By: Rosaline Collet M.D.   On: 06/25/2020 13:05    Pelvic/Bimanual Ext Genitalia No lesions, no swelling and no discharge observed on external genitalia.        Vagina Vagina pink and normal texture. No lesions or discharge observed in vagina.        Cervix Cervix is present. Cervix pink and of normal texture. No discharge observed.    Uterus Uterus is present and palpable. Uterus in normal position and normal size.        Adnexae Bilateral ovaries present and palpable. No tenderness on palpation.         Rectovaginal No rectal exam completed today since patient had no rectal complaints. No skin abnormalities observed on exam.     Smoking History: Patient is a former smoker that quit when she was 48 years old.    Patient Navigation: Patient education provided. Access to services provided for patient through B[facility]CP program.   Colorectal Cancer Screening: Per patient {Blank single:19197::has had colonoscopy completed on ***,has never had colonoscopy completed} No complaints today.    Breast and Cervical Cancer Risk Assessment: Patient does not have family history of breast cancer, known genetic mutations, or radiation treatment to the chest before age 89. Per patient has history of cervical dysplasia. Patient  has no history of being immunocompromised or DES exposure in-utero.   Risk Scores as of Encounter on 09/10/2024     [place] as of 08/24/2023           5-year 0.77%   Lifetime 7.16%   This patient is [place] but has no documented birth country, so the [place] model used data from [place] patients to calculate their risk score. Document a birth country in the Demographics activity for a more accurate score.         Last calculated by Logan Lyle BRAVO, CMA on 08/24/2023 at  2:34 PM        A: B[facility]CP exam with pap smear Complaint of ***  P: Referred patient to the [facility] for a screening mammogram on mobile unit. Appointment scheduled Tuesday, September 10, 2024 at 1430.  Driscilla Wanda SQUIBB, RN 09/09/2024 10:42 AM

## 2024-09-10 ENCOUNTER — Ambulatory Visit
Admission: RE | Admit: 2024-09-10 | Discharge: 2024-09-10 | Disposition: A | Discharge status: Home / Self Care | Source: Ambulatory Visit | Attending: Obstetrics and Gynecology | Admitting: Obstetrics and Gynecology

## 2024-09-10 ENCOUNTER — Ambulatory Visit: Payer: Self-pay | Admitting: *Deleted

## 2024-09-10 VITALS — BP 117/88 | Ht 62.0 in | Wt 148.0 lb

## 2024-09-10 DIAGNOSIS — Z1231 Encounter for screening mammogram for malignant neoplasm of breast: Secondary | ICD-10-CM

## 2024-09-10 DIAGNOSIS — Z01419 Encounter for gynecological examination (general) (routine) without abnormal findings: Secondary | ICD-10-CM

## 2024-09-10 NOTE — Patient Instructions (Signed)
 Explained breast self awareness with Hadassah LITTIE Sanders. Pap smear completed today. Let her know that her next Pap smear will be based on the result of today's Pap smear due to her history of abnormal Pap smears. Referred patient to the [facility] for a screening mammogram on mobile unit. Appointment scheduled Tuesday, September 10, 2024 at 1430. Patient aware of appointment and will be there. Let patient know will follow up with her within the next couple weeks with results of Pap smear by letter or phone. Informed patient that the Breast Center will follow up with her within the next couple of weeks with results of her mammogram by letter or phone. Hadassah LITTIE Sanders verbalized understanding.  Neysha Criado, Wanda Ship, RN 2:07 PM

## 2024-09-12 LAB — CYTOLOGY - PAP
Comment: NEGATIVE
Diagnosis: NEGATIVE
Diagnosis: REACTIVE
High risk HPV: NEGATIVE

## 2024-09-16 ENCOUNTER — Ambulatory Visit: Payer: Self-pay
